# Patient Record
Sex: Male | Born: 1970 | Race: White | Hispanic: Yes | Marital: Married | Smoking: Never smoker
Health system: Southern US, Community
[De-identification: ages and names within clinical notes are randomized; demographics above are authoritative.]

## PROBLEM LIST (undated history)

## (undated) DIAGNOSIS — C44300 Unspecified malignant neoplasm of skin of unspecified part of face: Secondary | ICD-10-CM

## (undated) DIAGNOSIS — E0590 Thyrotoxicosis, unspecified without thyrotoxic crisis or storm: Secondary | ICD-10-CM

## (undated) DIAGNOSIS — R634 Abnormal weight loss: Secondary | ICD-10-CM

## (undated) DIAGNOSIS — R439 Unspecified disturbances of smell and taste: Secondary | ICD-10-CM

## (undated) DIAGNOSIS — E119 Type 2 diabetes mellitus without complications: Secondary | ICD-10-CM

## (undated) HISTORY — DX: Unspecified disturbances of smell and taste: R43.9

## (undated) HISTORY — DX: Abnormal weight loss: R63.4

## (undated) HISTORY — DX: Thyrotoxicosis, unspecified without thyrotoxic crisis or storm: E05.90

## (undated) HISTORY — DX: Type 2 diabetes mellitus without complications: E11.9

## (undated) HISTORY — DX: Unspecified malignant neoplasm of skin of unspecified part of face: C44.300

---

## 2006-04-25 ENCOUNTER — Ambulatory Visit: Payer: Self-pay | Admitting: Family Medicine

## 2006-05-02 ENCOUNTER — Ambulatory Visit: Payer: Self-pay | Admitting: Family Medicine

## 2006-05-02 LAB — CONVERTED CEMR LAB
ALT: 38 units/L (ref 0–40)
AST: 33 units/L (ref 0–37)
Albumin: 3.9 g/dL (ref 3.5–5.2)
Alkaline Phosphatase: 50 units/L (ref 39–117)
Total Bilirubin: 0.8 mg/dL (ref 0.3–1.2)
Triglyceride fasting, serum: 44 mg/dL (ref 0–149)
VLDL: 9 mg/dL (ref 0–40)

## 2006-06-01 ENCOUNTER — Ambulatory Visit: Payer: Self-pay | Admitting: Family Medicine

## 2007-05-13 ENCOUNTER — Ambulatory Visit: Payer: Self-pay | Admitting: Family Medicine

## 2007-05-13 DIAGNOSIS — R439 Unspecified disturbances of smell and taste: Secondary | ICD-10-CM

## 2007-05-13 HISTORY — DX: Unspecified disturbances of smell and taste: R43.9

## 2007-05-14 LAB — CONVERTED CEMR LAB
Cholesterol: 162 mg/dL (ref 0–200)
HDL: 44.9 mg/dL (ref >39.0)
LDL Cholesterol: 103 mg/dL — ABNORMAL HIGH (ref 0–99)
Total CHOL/HDL Ratio: 3.6
Triglycerides: 72 mg/dL (ref 0–149)
VLDL: 14 mg/dL (ref 0–40)

## 2007-07-04 HISTORY — PX: VASECTOMY: SHX75

## 2008-04-07 ENCOUNTER — Ambulatory Visit: Payer: Self-pay | Admitting: Family Medicine

## 2008-04-07 LAB — CONVERTED CEMR LAB
ALT: 20 units/L (ref 0–53)
Albumin: 4.3 g/dL (ref 3.5–5.2)
Alkaline Phosphatase: 54 units/L (ref 39–117)
BUN: 13 mg/dL (ref 6–23)
Bilirubin, Direct: 0.1 mg/dL (ref 0.0–0.3)
CO2: 30 meq/L (ref 19–32)
Calcium: 9.3 mg/dL (ref 8.4–10.5)
Eosinophils Relative: 5.7 % — ABNORMAL HIGH (ref 0.0–5.0)
Glucose, Bld: 85 mg/dL (ref 70–99)
HCT: 40.5 % (ref 39.0–52.0)
Hemoglobin: 14 g/dL (ref 13.0–17.0)
LDL Cholesterol: 101 mg/dL — ABNORMAL HIGH (ref 0–99)
Lymphocytes Relative: 38.5 % (ref 12.0–46.0)
Monocytes Absolute: 0.3 10*3/uL (ref 0.1–1.0)
Monocytes Relative: 6.8 % (ref 3.0–12.0)
Neutro Abs: 2.2 10*3/uL (ref 1.4–7.7)
Potassium: 4.3 meq/L (ref 3.5–5.1)
RDW: 12.5 % (ref 11.5–14.6)
Sodium: 139 meq/L (ref 135–145)
Total CHOL/HDL Ratio: 4.1
Total Protein: 6.8 g/dL (ref 6.0–8.3)
Triglycerides: 83 mg/dL (ref 0–149)
WBC: 4.5 10*3/uL (ref 4.5–10.5)

## 2008-04-09 ENCOUNTER — Encounter (INDEPENDENT_AMBULATORY_CARE_PROVIDER_SITE_OTHER): Payer: Self-pay | Admitting: *Deleted

## 2009-04-09 ENCOUNTER — Ambulatory Visit: Payer: Self-pay | Admitting: Family Medicine

## 2009-04-14 ENCOUNTER — Encounter (INDEPENDENT_AMBULATORY_CARE_PROVIDER_SITE_OTHER): Payer: Self-pay | Admitting: *Deleted

## 2009-04-14 LAB — CONVERTED CEMR LAB
ALT: 20 U/L
AST: 22 U/L
Albumin: 4.2 g/dL
Alkaline Phosphatase: 60 U/L
BUN: 13 mg/dL
Basophils Absolute: 0 K/uL
Basophils Relative: 0.7 %
Bilirubin, Direct: 0.1 mg/dL
CO2: 22 meq/L
Calcium: 9.2 mg/dL
Chloride: 109 meq/L
Cholesterol: 164 mg/dL
Creatinine, Ser: 1 mg/dL
Eosinophils Absolute: 0.3 K/uL
Eosinophils Relative: 6.6 % — ABNORMAL HIGH
GFR calc non Af Amer: 89.02 mL/min
Glucose, Bld: 81 mg/dL
HCT: 42.8 %
HDL: 42.6 mg/dL
Hemoglobin: 14.2 g/dL
LDL Cholesterol: 111 mg/dL — ABNORMAL HIGH
Lymphocytes Relative: 34.6 %
Lymphs Abs: 1.5 K/uL
MCHC: 33.1 g/dL
MCV: 87.1 fL
Monocytes Absolute: 0.3 K/uL
Monocytes Relative: 7.6 %
Neutro Abs: 2.3 K/uL
Neutrophils Relative %: 50.5 %
Platelets: 201 K/uL
Potassium: 4.5 meq/L
RBC: 4.91 M/uL
RDW: 12.3 %
Sodium: 141 meq/L
TSH: 1.22 u[IU]/mL
Total Bilirubin: 1.2 mg/dL
Total CHOL/HDL Ratio: 4
Total Protein: 6.6 g/dL
Triglycerides: 53 mg/dL
VLDL: 10.6 mg/dL
WBC: 4.4 10*3/microliter — ABNORMAL LOW

## 2010-04-29 ENCOUNTER — Ambulatory Visit: Payer: Self-pay | Admitting: Family Medicine

## 2010-04-29 DIAGNOSIS — M25549 Pain in joints of unspecified hand: Secondary | ICD-10-CM | POA: Insufficient documentation

## 2010-05-03 LAB — CONVERTED CEMR LAB
Albumin: 3.9 g/dL (ref 3.5–5.2)
Basophils Relative: 0.7 % (ref 0.0–3.0)
CO2: 28 meq/L (ref 19–32)
Chloride: 107 meq/L (ref 96–112)
Creatinine, Ser: 1 mg/dL (ref 0.4–1.5)
Eosinophils Absolute: 0.3 10*3/uL (ref 0.0–0.7)
Hemoglobin: 14.1 g/dL (ref 13.0–17.0)
MCHC: 34.5 g/dL (ref 30.0–36.0)
MCV: 85.2 fL (ref 78.0–100.0)
Monocytes Absolute: 0.5 10*3/uL (ref 0.1–1.0)
Neutro Abs: 2.2 10*3/uL (ref 1.4–7.7)
Neutrophils Relative %: 48.9 % (ref 43.0–77.0)
RBC: 4.82 M/uL (ref 4.22–5.81)
TSH: 0.09 microintl units/mL — ABNORMAL LOW (ref 0.35–5.50)
Total CHOL/HDL Ratio: 4
Total Protein: 6.7 g/dL (ref 6.0–8.3)
Triglycerides: 65 mg/dL (ref 0.0–149.0)

## 2010-08-02 NOTE — Assessment & Plan Note (Signed)
Summary: CPX AND FASTING LABS///[facility]   Vital Signs:  Patient profile:   40 year old male Height:      66 inches Weight:      148 pounds BMI:     23.97 Pulse rate:   78 / minute BP sitting:   100 / 70  (left arm)  Vitals Entered By: Doristine Devoid CMA (April 29, 2010 8:05 AM) CC: CPX AND LABS    History of Present Illness: 40 yo man here today for CPE.    R hand pain- R hand dominant, pain w/ applying force.  pain localized to 1st MCP joint and heel of hand.  no known injury.  pain for 5-6 months.  pain w/ any type of pressure or force.  hasn't tried any OTC meds.  Preventive Screening-Counseling & Management  Alcohol-Tobacco     Alcohol drinks/day: <1     Smoking Status: never  Caffeine-Diet-Exercise     Does Patient Exercise: yes     Type of exercise: walk, run, lift      Sexual History:  currently monogamous.        Drug Use:  never.    Current Medications (verified): 1)  None  Allergies (verified): No Known Drug Allergies  Past History:  Past medical, surgical, family and social histories (including risk factors) reviewed, and no changes noted (except as noted below).  Past Medical History: Reviewed history from 05/13/2007 and no changes required. Unremarkable  Past Surgical History: Reviewed history from 04/07/2008 and no changes required. vasectomy 1/09  Family History: Reviewed history from 04/07/2008 and no changes required. mom and dad are alive and well no cancer, DM, CAD  Social History: Reviewed history from 04/07/2008 and no changes required. Occupation:[employer] Married 1 child- 2007 Never Smoked Alcohol use-yes: socially Drug use-no Regular exercise-yes  Review of Systems  The patient denies anorexia, fever, weight loss, weight gain, vision loss, decreased hearing, hoarseness, chest pain, syncope, dyspnea on exertion, peripheral edema, prolonged cough, headaches, abdominal pain, melena, hematochezia, severe indigestion/heartburn,  hematuria, suspicious skin lesions, depression, abnormal bleeding, enlarged lymph nodes, and testicular masses.    Physical Exam  General:  Well-developed,well-nourished,in no acute distress; alert,appropriate and cooperative throughout examination Head:  Normocephalic and atraumatic without obvious abnormalities. No apparent alopecia or balding. Eyes:  No corneal or conjunctival inflammation noted. EOMI. Perrla. Funduscopic exam benign, without hemorrhages, exudates or papilledema. Vision grossly normal. Ears:  External ear exam shows no significant lesions or deformities.  Otoscopic examination reveals clear canals, tympanic membranes are intact bilaterally without bulging, retraction, inflammation or discharge. Hearing is grossly normal bilaterally. Nose:  External nasal examination shows no deformity or inflammation. Nasal mucosa are pink and moist without lesions or exudates. Mouth:  Oral mucosa and oropharynx without lesions or exudates.  Teeth in good repair. Neck:  No deformities, masses, or tenderness noted. Lungs:  Normal respiratory effort, chest expands symmetrically. Lungs are clear to auscultation, no crackles or wheezes. Heart:  Normal rate and regular rhythm. S1 and S2 normal without gallop, murmur, click, rub or other extra sounds. Abdomen:  Bowel sounds positive,abdomen soft and non-tender without masses, organomegaly or hernias noted. Genitalia:  Testes bilaterally descended without nodularity, tenderness or masses. No scrotal masses or lesions. No penis lesions or urethral discharge. Msk:  + TTP over 1st CMC and MCP joint on R.  pain only on palmar surface.  pain w/ abduction and adduction of thumb.  no redness or swelling Pulses:  +2 carotids, radials, DP Extremities:  No clubbing,  cyanosis, edema, or deformity noted with normal full range of motion of all joints.   Neurologic:  No cranial nerve deficits noted. Station and gait are normal. Plantar reflexes are down-going  bilaterally. DTRs are symmetrical throughout. Sensory, motor and coordinative functions appear intact. Skin:  Intact without suspicious lesions or rashes Cervical Nodes:  No lymphadenopathy noted Inguinal Nodes:  No significant adenopathy Psych:  Cognition and judgment appear intact. Alert and cooperative with normal attention span and concentration. No apparent delusions, illusions, hallucinations   Impression & Recommendations:  Problem # 1:  PREVENTIVE HEALTH CARE (ICD-V70.0) Assessment Unchanged PE WNL.  check labs.  anticipatory guidance provided. Orders: Venipuncture (13244) TLB-Lipid Panel (80061-LIPID) TLB-BMP (Basic Metabolic Panel-BMET) (80048-METABOL) TLB-CBC Platelet - w/Differential (85025-CBCD) TLB-Hepatic/Liver Function Pnl (80076-HEPATIC) TLB-TSH (Thyroid Stimulating Hormone) (84443-TSH)  Problem # 2:  HAND, PAIN (WNU-272.53) Assessment: New pain over 1st CMC and MCP joint on R.  start NSAIDs.  if no improvement refer to ortho.  Pt expresses understanding and is in agreement w/ this plan.  Complete Medication List: 1)  Naproxen 500 Mg Tabs (Naproxen) .Marland Kitchen.. 1 tab by mouth two times a day x10 days and then as needed.  take w/ food.  Patient Instructions: 1)  Follow up in 1 year or as needed 2)  We'll notify you of your lab results 3)  Keep up the good work!  Your exam looks great! 4)  Take the Naproxen for your hand pain- if it's not better in 2 weeks, call me and we'll send you to the orthopedic doctor 5)  Call with any questions or concerns 6)  Have a great holiday season!! Prescriptions: NAPROXEN 500 MG TABS (NAPROXEN) 1 tab by mouth two times a day x10 days and then as needed.  take w/ food.  #60 x 0   Entered and Authorized by:   Neena Rhymes MD   Signed by:   Neena Rhymes MD on 04/29/2010   Method used:   Print then Give to Patient   RxID:   [id number]    Orders Added: 1)  Venipuncture [75643] 2)  TLB-Lipid Panel [80061-LIPID] 3)  TLB-BMP  (Basic Metabolic Panel-BMET) [80048-METABOL] 4)  TLB-CBC Platelet - w/Differential [85025-CBCD] 5)  TLB-Hepatic/Liver Function Pnl [80076-HEPATIC] 6)  TLB-TSH (Thyroid Stimulating Hormone) [84443-TSH] 7)  Est. Patient 18-39 years [32951]

## 2010-08-26 ENCOUNTER — Ambulatory Visit (INDEPENDENT_AMBULATORY_CARE_PROVIDER_SITE_OTHER): Payer: Managed Care, Other (non HMO) | Admitting: Family Medicine

## 2010-08-26 ENCOUNTER — Encounter: Payer: Self-pay | Admitting: Family Medicine

## 2010-08-26 DIAGNOSIS — R634 Abnormal weight loss: Secondary | ICD-10-CM

## 2010-08-26 HISTORY — DX: Abnormal weight loss: R63.4

## 2010-08-29 DIAGNOSIS — E119 Type 2 diabetes mellitus without complications: Secondary | ICD-10-CM | POA: Insufficient documentation

## 2010-08-29 DIAGNOSIS — E0590 Thyrotoxicosis, unspecified without thyrotoxic crisis or storm: Secondary | ICD-10-CM

## 2010-08-29 DIAGNOSIS — Z8639 Personal history of other endocrine, nutritional and metabolic disease: Secondary | ICD-10-CM | POA: Insufficient documentation

## 2010-08-29 HISTORY — DX: Thyrotoxicosis, unspecified without thyrotoxic crisis or storm: E05.90

## 2010-08-29 LAB — CONVERTED CEMR LAB
AST: 23 units/L (ref 0–37)
Albumin: 3.9 g/dL (ref 3.5–5.2)
Alkaline Phosphatase: 107 units/L (ref 39–117)
Basophils Relative: 1 % (ref 0–1)
CO2: 25 meq/L (ref 19–32)
Glucose, Bld: 98 mg/dL (ref 70–99)
Lymphs Abs: 1.6 10*3/uL (ref 0.7–4.0)
Monocytes Relative: 10 % (ref 3–12)
Neutro Abs: 1.6 10*3/uL — ABNORMAL LOW (ref 1.7–7.7)
Neutrophils Relative %: 41 % — ABNORMAL LOW (ref 43–77)
Potassium: 4.6 meq/L (ref 3.5–5.3)
RBC: 5.04 M/uL (ref 4.22–5.81)
Sodium: 140 meq/L (ref 135–145)
T3, Free: 14.6 pg/mL — ABNORMAL HIGH (ref 2.3–4.2)
Total Bilirubin: 0.5 mg/dL (ref 0.3–1.2)
WBC: 3.8 10*3/uL — ABNORMAL LOW (ref 4.0–10.5)

## 2010-08-30 NOTE — Assessment & Plan Note (Signed)
Summary: weight loss/cbs   Vital Signs:  Patient profile:   40 year old male Weight:      130 pounds BMI:     21.06 O2 Sat:      98 % on Room air Temp:     98.7 degrees F oral Pulse rate:   106 / minute BP sitting:   108 / 60  (left arm)  Vitals Entered By: Doristine Devoid CMA (August 26, 2010 2:50 PM)  O2 Flow:  Room air CC: weight loss HA and fatigue intermittent diarrhea xdec.   History of Present Illness: 40 yo man here today for weight loss.  has lost 18 lbs since CPE the end of Oct.  wife reports sxs started in December.  denies palpitations.  + fatigue.  intermittant, infrequent abdominal pain associated w/ loose stools.  + HAs, mild photophobia.  also having 'pimple like rash' on back and chest.  having tremor and shaking.  increased hunger and thirst  TSH was low on 10/28- request was made for free T3/T4 to be added to labs.  CMA made request the following Monday.  no results were ever posted.  Current Medications (verified): 1)  None  Allergies (verified): No Known Drug Allergies  Past History:  Past medical, surgical, family and social histories (including risk factors) reviewed for relevance to current acute and chronic problems.  Past Medical History: Reviewed history from 05/13/2007 and no changes required. Unremarkable  Past Surgical History: Reviewed history from 04/07/2008 and no changes required. vasectomy 1/09  Family History: Reviewed history from 04/07/2008 and no changes required. mom and dad are alive and well no cancer, DM, CAD  Social History: Reviewed history from 04/07/2008 and no changes required. Occupation:[employer] Married 1 child- 2007 Never Smoked Alcohol use-yes: socially Drug use-no Regular exercise-yes  Review of Systems      See HPI  Physical Exam  General:  thin appearing, otherwise normal.  NAD Head:  NCAT Eyes:  PERRL, EOMI Neck:  No deformities, masses, or tenderness noted. Lungs:  Normal respiratory effort, chest  expands symmetrically. Lungs are clear to auscultation, no crackles or wheezes. Heart:  Normal rate and regular rhythm. S1 and S2 normal without gallop, murmur, click, rub or other extra sounds. Abdomen:  Bowel sounds positive,abdomen soft and non-tender without masses, organomegaly or hernias noted. Pulses:  +2 carotids, radials, DP Extremities:  No clubbing, cyanosis, edema, or deformity noted with normal full range of motion of all joints.   Neurologic:  No cranial nerve deficits noted. Station and gait are normal. Plantar reflexes are down-going bilaterally. DTRs are symmetrical throughout. Sensory, motor and coordinative functions appear intact. Skin:  Intact without suspicious lesions or rashes.  scatterred white heads on chest and back Cervical Nodes:  No lymphadenopathy noted Axillary Nodes:  No palpable lymphadenopathy Psych:  Cognition and judgment appear intact. Alert and cooperative with normal attention span and concentration. No apparent delusions, illusions, hallucinations   Impression & Recommendations:  Problem # 1:  WEIGHT LOSS (ICD-783.21) Assessment New most likely due to untreated hyperthyroid.  will repeat labs.  must also r/o DM due to increased hunger and thirst.  will also r/o leukemia w/ CBC w/ diff.  apologized to pt and wife for mistake in add-on labs and that this wasn't addressed sooner.  will proceed w/ weight loss work up and call family on Monday w/ follow up plan.  pt understanding of mistake and appreciative that I brought this to management's attention.  will follow. Orders: Venipuncture (56213)  TLB-TSH (Thyroid Stimulating Hormone) [phone]) Specimen Handling (78295)  Patient Instructions: 1)  We will notify you first thing Monday of your lab results 2)  Make sure you are eating more than usual 3)  Drink plenty of fluids 4)  Call with any questions or concerns 5)  Hang in there!  We'll get to the bottom of this!!!   Orders Added: 1)  Venipuncture  [36415] 2)  TLB-TSH (Thyroid Stimulating Hormone) [84443-TSH] 3)  Specimen Handling [99000] 4)  Est. Patient Level III [62130]

## 2010-08-31 ENCOUNTER — Ambulatory Visit: Payer: Managed Care, Other (non HMO) | Admitting: Endocrinology

## 2010-09-02 ENCOUNTER — Ambulatory Visit (INDEPENDENT_AMBULATORY_CARE_PROVIDER_SITE_OTHER): Payer: Managed Care, Other (non HMO) | Admitting: Endocrinology

## 2010-09-02 ENCOUNTER — Encounter: Payer: Self-pay | Admitting: Endocrinology

## 2010-09-02 DIAGNOSIS — E0590 Thyrotoxicosis, unspecified without thyrotoxic crisis or storm: Secondary | ICD-10-CM

## 2010-09-05 ENCOUNTER — Telehealth: Payer: Self-pay | Admitting: Endocrinology

## 2010-09-07 ENCOUNTER — Telehealth: Payer: Self-pay | Admitting: Endocrinology

## 2010-09-13 NOTE — Progress Notes (Signed)
Summary: med clarification  Phone Note Other Incoming Call back at [phone] ref #. [phone]   Caller: [facility] Summary of Call: [facility] called requesting clarification on pt's Methimazole rx. Per pharmacist, the maximum recommended dose is 60mg /day in 1-3 divided doses.  Initial call taken by: Brenton Grills CMA Duncan Dull),  September 08, 2010 8:30 AM  Follow-up for Phone Call        please fill rx as prescribed.  Follow-up by: Minus Breeding MD,  September 08, 2010 8:42 AM  Additional Follow-up for Phone Call Additional follow up Details #1::        [facility] informed of MD's advisement. Additional Follow-up by: Brenton Grills CMA Duncan Dull),  September 08, 2010 9:10 AM

## 2010-09-13 NOTE — Progress Notes (Signed)
Summary: Call Report  Phone Note Other Incoming   Caller: [facility] Call Report Triage Record Num: [id number] Operator: Amy Head Patient Name: Christopher Mckenzie Call Date & Time: 09/03/2010 12:10:41PM Patient Phone: [phone] PCP: Romero Belling Patient Gender: Male PCP Fax : [phone] Patient DOB: [birth year] Practice Name: [facility] Reason for Call: Wife/[name], calling and states that pt was seen in office 09/02/10 and dx with Hyperthyroidism and given RX for Methimazole 10MG  4 tabs BID, #240, One Refill. (Verified by Estevan Oaks, RN in [facility]) Needs order faxed to [facility] @ [phone]. Protocol(s) Used: Office Note Recommended Outcome per Protocol: Information Noted and Sent to Office Reason for Outcome: Caller information to office Care Advice:  ~ 09/03/2010 12:35:45PM Page 1 of 1 CAN Initial call taken by: Margaret Pyle, CMA,  September 05, 2010 8:30 AM    Prescriptions: ONETOUCH ULTRA BLUE  STRP (GLUCOSE BLOOD) once daily, and lancets 250.00  #150 x 3   Entered by:   Margaret Pyle, CMA   Authorized by:   Minus Breeding MD   Signed by:   Margaret Pyle, CMA on 09/05/2010   Method used:   Faxed to ...       [facility] (mail-order)             , [place]         Ph: [phone]       Fax: [phone]   RxID:   [id number] METHIMAZOLE 10 MG TABS (METHIMAZOLE) 4 tabs two times a day  #720 x 1   Entered by:   Margaret Pyle, CMA   Authorized by:   Minus Breeding MD   Signed by:   Margaret Pyle, CMA on 09/05/2010   Method used:   Faxed to ...       [facility] (mail-order)             , [place]         Ph: [phone]       Fax: [phone]   RxID:   [id number]

## 2010-09-13 NOTE — Assessment & Plan Note (Signed)
Summary: NEW ENDO CONSULT/ HYPERTHYROID / DM / DR Beverely Low Rosann Auerbach Natale Milch   Vital Signs:  Patient profile:   40 year old male Height:      66 inches (167.64 cm) Weight:      134 pounds (60.91 kg) BMI:     21.71 O2 Sat:      97 % on Room air Temp:     98.0 degrees F (36.67 degrees C) oral Pulse rate:   95 / minute BP sitting:   116 / 62  (left arm) Cuff size:   regular  Vitals Entered By: Brenton Grills CMA Duncan Dull) (September 02, 2010 2:58 PM)  O2 Flow:  Room air CC: New Endo Consult/Hyperthyroidism/Dr. Tabori/aj Is Patient Diabetic? No   Referring Provider:  Neena Rhymes MD Primary Provider:  Neena Rhymes MD  CC:  New Endo Consult/Hyperthyroidism/Dr. Tabori/aj.  History of Present Illness: pt states few mos of moderate weakness of the thighs, and assoc weight loss of 18 lbs.  Current Medications (verified): 1)  None  Allergies (verified): No Known Drug Allergies  Past History:  Past Medical History: DIABETES MELLITUS (ICD-250.00) HYPERTHYROIDISM (ICD-242.90) WEIGHT LOSS (ICD-783.21) HAND, PAIN (ICD-719.44) ANOSMIA (ICD-781.1) PREVENTIVE HEALTH CARE (ICD-V70.0)  Family History: Reviewed history from 04/07/2008 and no changes required. mom and dad are alive and well no cancer, DM, CAD Kidney Disease (Parent) mother has thyroid and kidney cancer.  Social History: Reviewed history from 04/07/2008 and no changes required. Occupation:[employer] Married 1 child- 2007 Never Smoked Alcohol use-yes: socially Drug use-no Regular exercise-yes originally from [place], in [place] x many years  Review of Systems  The patient denies fever.         he reports fatigue, headache, light senstivity, excessive diaphoresis, and tremor.  he has intermittent dyspeptic sxs, and diarrhea.   denies hoarseness, double vision, palpitations, sob, polyuria, myalgias, numbness, anxiety, hypoglycemia, easy bruising, and rhinorrhea.  Physical Exam  General:  thin appearing, otherwise  normal.  NAD Head:  head: no deformity eyes: no periorbital swelling, no proptosis external nose and ears are normal mouth: no lesion seen Neck:  thyroid is 2x normal size on the right, and normal on the left. no nodule Lungs:  Clear to auscultation bilaterally. Normal respiratory effort.  Heart:  Regular rate and rhythm without murmurs or gallops noted. Normal S1,S2.   Abdomen:  abdomen is soft, nontender.  no hepatosplenomegaly.   not distended.  no hernia  Msk:  muscle bulk and strength are grossly, diffusely, and lsightly decreased.  no obvious joint swelling.  gait is normal and steady  Extremities:  no deformity.  no edema  Neurologic:  cn 2-12 grossly intact.   readily moves all 4's.   sensation is intact to touch on all 4's there is a slight tremor of the hands Skin:  normal texture and temp.  no rash.  not diaphoretic  Cervical Nodes:  No significant adenopathy.  Psych:  Alert and cooperative; normal mood and affect; normal attention span and concentration.     Impression & Recommendations:  Problem # 1:  HYPERTHYROIDISM (ICD-242.90)  Problem # 2:  DIABETES MELLITUS (ICD-250.00) he is evolving type 1 dm  Problem # 3:  WEIGHT LOSS (ICD-783.21) due to #1  Medications Added to Medication List This Visit: 1)  Methimazole 10 Mg Tabs (Methimazole) .... 4 tabs two times a day 2)  Alprazolam 0.25 Mg Tabs (Alprazolam) .Marland Kitchen.. 1 every 4 hrs as needed for thyroid symptoms 3)  Onetouch Ultra Blue Strp (Glucose blood) .... Once  daily, and lancets 250.00  Other Orders: Consultation Level IV (11914)  Patient Instructions: 1)  take methimazole 4x10 mg, two times a day 2)  if ever you have fever while taking this , stop it and call us, because of the risk of a rare side-effect 3)  Please schedule a follow-up appointment in 2-3 weeks. 4)  take alprazolam as needed for thyroid symptoms. 5)  here are 2 blood sugar meters.  i have sent a prescription for strips to your pharmacy. 6)   check your blood sugar 1 time a day.  vary the time of day when you check, between before the 3 meals, and at bedtime.  also check if you have symptoms of your blood sugar being too high or too low.  please keep a record of the readings and bring it to your next appointment here.  please call us sooner if you are having low blood sugar episodes. Prescriptions: ONETOUCH ULTRA BLUE  STRP (GLUCOSE BLOOD) once daily, and lancets 250.00  #50 x 11   Entered and Authorized by:   Minus Breeding MD   Signed by:   Minus Breeding MD on 09/02/2010   Method used:   Electronically to        [facility] [phone]* (retail)       [address]       [place], [place]  [address]       Ph: [phone] or [phone]       Fax: [phone]   RxID:   [id number] ALPRAZOLAM 0.25 MG TABS (ALPRAZOLAM) 1 every 4 hrs as needed for thyroid symptoms  #50 x 1   Entered and Authorized by:   Minus Breeding MD   Signed by:   Minus Breeding MD on 09/02/2010   Method used:   Print then Give to Patient   RxID:   [id number] METHIMAZOLE 10 MG TABS (METHIMAZOLE) 4 tabs two times a day  #240 x 2   Entered and Authorized by:   Minus Breeding MD   Signed by:   Minus Breeding MD on 09/02/2010   Method used:   Electronically to        [facility] [phone]* (retail)       [address]       [place], [place]  [address]       Ph: [phone] or [phone]       Fax: [phone]   RxID:   [id number]    Orders Added: 1)  Consultation Level IV [27035]

## 2010-09-23 ENCOUNTER — Ambulatory Visit: Payer: Managed Care, Other (non HMO) | Admitting: Endocrinology

## 2010-10-13 ENCOUNTER — Encounter: Payer: Self-pay | Admitting: Endocrinology

## 2010-10-18 ENCOUNTER — Encounter: Payer: Self-pay | Admitting: Endocrinology

## 2010-10-18 ENCOUNTER — Other Ambulatory Visit (INDEPENDENT_AMBULATORY_CARE_PROVIDER_SITE_OTHER): Payer: Managed Care, Other (non HMO)

## 2010-10-18 ENCOUNTER — Ambulatory Visit (INDEPENDENT_AMBULATORY_CARE_PROVIDER_SITE_OTHER): Payer: Managed Care, Other (non HMO) | Admitting: Endocrinology

## 2010-10-18 VITALS — BP 104/62 | HR 73 | Temp 98.6°F | Ht 65.0 in | Wt 130.8 lb

## 2010-10-18 DIAGNOSIS — E0590 Thyrotoxicosis, unspecified without thyrotoxic crisis or storm: Secondary | ICD-10-CM

## 2010-10-18 LAB — T4, FREE: Free T4: 0.87 ng/dL (ref 0.60–1.60)

## 2010-10-18 NOTE — Patient Instructions (Addendum)
blood tests are being ordered for you today.  please call [phone] to hear your test results. Please make a follow-up appointment in 1 month Continue to follow the blood sugar, and please call if it stays over 200. (update: i left message on phone-tree:  Reduce tapazole to 2x10 mg bid)

## 2010-10-18 NOTE — Progress Notes (Signed)
  Subjective:    Patient ID: Christopher Mckenzie, male    DOB: 02-26-1971, 40 y.o.   MRN: [id number]  HPI Although he has not yet regained weight, he better in general.  He still has some insomnia and night sweats. he brings a record of his cbg's which i have reviewed today.  It varies from 80-160.  The vast majority are approx 100. Past Medical History  Diagnosis Date  . DIABETES MELLITUS 08/29/2010  . HYPERTHYROIDISM 08/29/2010  . WEIGHT LOSS 08/26/2010  . ANOSMIA 05/13/2007   Past Surgical History  Procedure Date  . Vasectomy 07/2007    reports that he has never smoked. He does not have any smokeless tobacco history on file. He reports that he drinks alcohol. He reports that he does not use illicit drugs. family history includes Cancer in his mother and Kidney disease in his mother.  There is no history of Diabetes and Heart disease. No Known Allergies  Review of Systems Denies fever.     Objective:   Physical Exam GENERAL: no distress Neck:  Thyroid is 2x normal size on the right, and normal on the left.  No nodule.   Skin:  Not diaphoretic.    Lab Results  Component Value Date   TSH 0.07* 10/18/2010  free t4 is improved    Assessment & Plan:  Hyperthyroidism, improved Dm, unchanged

## 2010-11-09 ENCOUNTER — Other Ambulatory Visit: Payer: Self-pay | Admitting: *Deleted

## 2010-11-09 MED ORDER — ALPRAZOLAM 0.25 MG PO TABS: ORAL_TABLET | ORAL | Status: DC

## 2010-11-09 NOTE — Telephone Encounter (Signed)
R'cd fax from [facility] for refill of pt's Alprazolam rx-please advise  Last OV-04/17/012  Last Filled-09/14/2010

## 2010-11-09 NOTE — Telephone Encounter (Signed)
i signed

## 2010-11-10 NOTE — Telephone Encounter (Signed)
Rx faxed to pharmacy  

## 2010-11-17 ENCOUNTER — Other Ambulatory Visit (INDEPENDENT_AMBULATORY_CARE_PROVIDER_SITE_OTHER): Payer: Managed Care, Other (non HMO)

## 2010-11-17 ENCOUNTER — Encounter: Payer: Self-pay | Admitting: Endocrinology

## 2010-11-17 ENCOUNTER — Ambulatory Visit (INDEPENDENT_AMBULATORY_CARE_PROVIDER_SITE_OTHER): Payer: Managed Care, Other (non HMO) | Admitting: Endocrinology

## 2010-11-17 DIAGNOSIS — E0590 Thyrotoxicosis, unspecified without thyrotoxic crisis or storm: Secondary | ICD-10-CM

## 2010-11-17 DIAGNOSIS — R51 Headache: Secondary | ICD-10-CM | POA: Insufficient documentation

## 2010-11-17 DIAGNOSIS — E119 Type 2 diabetes mellitus without complications: Secondary | ICD-10-CM

## 2010-11-17 DIAGNOSIS — R519 Headache, unspecified: Secondary | ICD-10-CM | POA: Insufficient documentation

## 2010-11-17 LAB — HEMOGLOBIN A1C: Hgb A1c MFr Bld: 5.3 % (ref 4.6–6.5)

## 2010-11-17 NOTE — Patient Instructions (Addendum)
blood tests are being ordered for you today.  please call [phone] to hear your test results. pending the test results, please continue the same medications for now Please make a follow-up appointment in 2 months check your blood sugar 1 time a day.  vary the time of day when you check, between before the 3 meals, and at bedtime.  also check if you have symptoms of your blood sugar being too high or too low.  please keep a record of the readings and bring it to your next appointment here.  please call us sooner if you are having low blood sugar episodes. good diet and exercise habits significanly improve the control of your diabetes.  please let me know if you wish to be referred to a dietician.  high blood sugar is very risky to your health.  you should see an eye doctor every year. controlling your blood pressure and cholesterol drastically reduces the damage diabetes does to your body.  this also applies to quitting smoking.  please discuss these with your doctor.  you should take an aspirin every day, unless you have been advised by a doctor not to. if ever you have fever while taking this medication, stop it and call us, because of the risk of a rare side-effect (update: i left message on phone-tree:  You don't need dm med now.  Reduce tapazole to 10 mg qd.  Call if you want to pursue i-131 rx).

## 2010-11-17 NOTE — Progress Notes (Signed)
  Subjective:    Patient ID: Christopher Mckenzie, male    DOB: 03-10-1971, 40 y.o.   MRN: [id number]  HPI pt states he feels well in general.  He has regained some of the weight he lost.  He takes tapazole only 10 mg bid. no cbg record, but states cbg's are in the low-100's. Past Medical History  Diagnosis Date  . DIABETES MELLITUS 08/29/2010  . HYPERTHYROIDISM 08/29/2010  . WEIGHT LOSS 08/26/2010  . ANOSMIA 05/13/2007    Past Surgical History  Procedure Date  . Vasectomy 07/2007    History   Social History  . Marital Status: Divorced    Spouse Name: N/A    Number of Children: 1  . Years of Education: N/A   Occupational History  .  [employer]   Social History Main Topics  . Smoking status: Never Smoker   . Smokeless tobacco: Not on file  . Alcohol Use: Yes     socially  . Drug Use: No  . Sexually Active:    Other Topics Concern  . Not on file   Social History Narrative   Originally from [place], [place] x many yearsRegular Exercise-YesMarried 1 child-2007    Current Outpatient Prescriptions on File Prior to Visit  Medication Sig Dispense Refill  . ALPRAZolam (XANAX) 0.25 MG tablet 1 every 4 hours as needed for thyroid symptoms  50 tablet  3  . glucose blood (ONE TOUCH ULTRA TEST) test strip Once daily, dx 250.00      . methimazole (TAPAZOLE) 10 MG tablet 1 tablet, two times a day        No Known Allergies  Family History  Problem Relation Age of Onset  . Diabetes Neg Hx   . Heart disease Neg Hx   . Kidney disease Mother     Kidney Cancer  . Cancer Mother     thyroid and kidney cancer    BP 102/62  Pulse 77  Temp(Src) 97.8 F (36.6 C) (Oral)  Ht 5\' 5"  (1.651 m)  Wt 138 lb (62.596 kg)  BMI 22.96 kg/m2  SpO2 97%    Review of Systems Denies fever.  He has a slight headache.    Objective:   Physical Exam GENERAL: no distress Neuro: no tremor Skin: not diaphoretic.    Lab Results  Component Value Date   TSH 5.87* 11/17/2010   Lab Results  Component  Value Date   HGBA1C 5.3 11/17/2010      Assessment & Plan:  Dm, improved.  No med needed now Hyperthyroidism, .improved.

## 2010-11-18 LAB — SEDIMENTATION RATE: Sed Rate: 3 mm/hr (ref 0–22)

## 2011-01-27 ENCOUNTER — Other Ambulatory Visit (INDEPENDENT_AMBULATORY_CARE_PROVIDER_SITE_OTHER): Payer: Managed Care, Other (non HMO)

## 2011-01-27 ENCOUNTER — Ambulatory Visit (INDEPENDENT_AMBULATORY_CARE_PROVIDER_SITE_OTHER): Payer: Managed Care, Other (non HMO) | Admitting: Endocrinology

## 2011-01-27 ENCOUNTER — Encounter: Payer: Self-pay | Admitting: Endocrinology

## 2011-01-27 VITALS — BP 102/68 | HR 64 | Temp 97.9°F | Ht 65.0 in | Wt 139.4 lb

## 2011-01-27 DIAGNOSIS — E0590 Thyrotoxicosis, unspecified without thyrotoxic crisis or storm: Secondary | ICD-10-CM

## 2011-01-27 NOTE — Patient Instructions (Addendum)
blood tests are being ordered for you today.  please call [phone] to hear your test results.  You will be prompted to enter the 9-digit "MRN" number that appears at the top left of this page, followed by #.  Then you will hear the message. if ever you have fever while taking this medication, stop it and call us, because of the risk of a rare side-effect If you decide to take the radioactive iodine treatment pill, you would first stop the methimazole pill.  A few weeks later, we would check a thyroid "scan" (a special, but easy and painless type of thyroid x ray).  It works like this: you go to the x-ray department of the hospital to swallow a pill, which contains a miniscule amount of radiation.  You will not notice any symptoms from this.  You will go back to the x-ray department the next day, to lie down in front of a camera.  The results of this will be sent to me.   Based on the results, i hope to order for you a treatment pill of radioactive iodine.  Although it is a larger amount of radiation, you will again notice no symptoms from this.  The pill is gone from your body in a few days (during which you should stay away from other people), but takes several months to work.  Therefore, you should resume the methimazole 5 days after the treatment. please return here approximately 6-8 weeks after the treatment.  This radioactive iodine treatment has been available for many years, and the only known side-effect is an underactive thyroid, which is very likely in your situation.  It is possible that i would eventually prescribe for you a thyroid hormone pill, which is very inexpensive.  You don't have to worry about side-effects of this thyroid hormone pill, because it is the same molecule your thyroid makes.

## 2011-01-27 NOTE — Progress Notes (Signed)
  Subjective:    Patient ID: Christopher Mckenzie, male    DOB: 1970/10/14, 40 y.o.   MRN: [id number]  HPI Pt states 1 month of moderately excessive diaphoresis, worst on the head, and assoc fatigue.  He has reduced tapazole to 10 mg qd, as advised. Past Medical History  Diagnosis Date  . DIABETES MELLITUS 08/29/2010  . HYPERTHYROIDISM 08/29/2010  . WEIGHT LOSS 08/26/2010  . ANOSMIA 05/13/2007    Past Surgical History  Procedure Date  . Vasectomy 07/2007    History   Social History  . Marital Status: Divorced    Spouse Name: N/A    Number of Children: 1  . Years of Education: N/A   Occupational History  .  [employer]   Social History Main Topics  . Smoking status: Never Smoker   . Smokeless tobacco: Not on file  . Alcohol Use: Yes     socially  . Drug Use: No  . Sexually Active:    Other Topics Concern  . Not on file   Social History Narrative   Originally from [place] x many yearsRegular Exercise-YesMarried 1 child-2007    Current Outpatient Prescriptions on File Prior to Visit  Medication Sig Dispense Refill  . ALPRAZolam (XANAX) 0.25 MG tablet 1 every 4 hours as needed for thyroid symptoms  50 tablet  3  . glucose blood (ONE TOUCH ULTRA TEST) test strip Once daily, dx 250.00      . methimazole (TAPAZOLE) 10 MG tablet 1 tablet, two times a day        No Known Allergies  Family History  Problem Relation Age of Onset  . Diabetes Neg Hx   . Heart disease Neg Hx   . Kidney disease Mother     Kidney Cancer  . Cancer Mother     thyroid and kidney cancer    BP 102/68  Pulse 64  Temp(Src) 97.9 F (36.6 C) (Oral)  Ht 5\' 5"  (1.651 m)  Wt 139 lb 6.4 oz (63.231 kg)  BMI 23.20 kg/m2  SpO2 96%  Review of Systems Denies tremor, fever, and palpitations.    Objective:   Physical Exam GENERAL: no distress Neck: thyroid is slightly and diffusely enlarged. Skin:  Not diaphoretic.    Assessment & Plan:  Hyperthyroidism, clinically slightly worse

## 2011-01-30 LAB — T4, FREE: Free T4: 0.75 ng/dL (ref 0.60–1.60)

## 2011-02-21 ENCOUNTER — Ambulatory Visit: Payer: Managed Care, Other (non HMO) | Admitting: Endocrinology

## 2011-05-05 ENCOUNTER — Encounter: Payer: Self-pay | Admitting: Family Medicine

## 2011-05-05 ENCOUNTER — Ambulatory Visit (INDEPENDENT_AMBULATORY_CARE_PROVIDER_SITE_OTHER): Payer: Managed Care, Other (non HMO) | Admitting: Family Medicine

## 2011-05-05 DIAGNOSIS — Z0000 Encounter for general adult medical examination without abnormal findings: Secondary | ICD-10-CM | POA: Insufficient documentation

## 2011-05-05 NOTE — Patient Instructions (Signed)
Schedule a lab visit- do not eat before this appt We'll notify you of your lab results once we have them Keep up the good work!  You look great! Call with any questions or concerns Happy Holidays!

## 2011-05-05 NOTE — Progress Notes (Signed)
  Subjective:    Patient ID: Christopher Mckenzie, male    DOB: 12-12-1970, 40 y.o.   MRN: [id number]  HPI CPE- seeing Dr Everardo All for hyperthyroid and DM (caused by hyperthyroid).  No concerns.   Review of Systems Patient reports no vision/hearing changes, anorexia, fever ,adenopathy, persistant/recurrent hoarseness, swallowing issues, chest pain, palpitations, edema, persistant/recurrent cough, hemoptysis, dyspnea (rest,exertional, paroxysmal nocturnal), gastrointestinal  bleeding (melena, rectal bleeding), abdominal pain, excessive heart burn, GU symptoms (dysuria, hematuria, voiding/incontinence issues) syncope, focal weakness, memory loss, numbness & tingling, skin/hair/nail changes, depression, anxiety, abnormal bruising/bleeding, musculoskeletal symptoms/signs.     Objective:   Physical Exam BP 110/65  Pulse 75  Temp(Src) 98.1 F (36.7 C) (Oral)  Ht 5' 5.5" (1.664 m)  Wt 143 lb (64.864 kg)  BMI 23.43 kg/m2  General Appearance:    Alert, cooperative, no distress, appears stated age  Head:    Normocephalic, without obvious abnormality, atraumatic  Eyes:    PERRL, conjunctiva/corneas clear, EOM's intact, fundi    benign, both eyes       Ears:    Normal TM's and external ear canals, both ears  Nose:   Nares normal, septum midline, mucosa normal, no drainage   or sinus tenderness  Throat:   Lips, mucosa, and tongue normal; teeth and gums normal  Neck:   Supple, symmetrical, trachea midline, no adenopathy;       thyroid:  No enlargement/tenderness/nodules  Back:     Symmetric, no curvature, ROM normal, no CVA tenderness  Lungs:     Clear to auscultation bilaterally, respirations unlabored  Chest wall:    No tenderness or deformity  Heart:    Regular rate and rhythm, S1 and S2 normal, no murmur, rub   or gallop  Abdomen:     Soft, non-tender, bowel sounds active all four quadrants,    no masses, no organomegaly  Genitalia:    Normal male without lesion, masses,discharge or tenderness    Rectal:    Deferred due to young age  Extremities:   Extremities normal, atraumatic, no cyanosis or edema  Pulses:   2+ and symmetric all extremities  Skin:   Skin color, texture, turgor normal, no rashes or lesions  Lymph nodes:   Cervical, supraclavicular, and axillary nodes normal  Neurologic:   CNII-XII intact. Normal strength, sensation and reflexes      throughout          Assessment & Plan:

## 2011-05-06 NOTE — Assessment & Plan Note (Signed)
Pt's PE WNL.  Future orders placed for labs.  Anticipatory guidance provided.  Pt declines flu shot.

## 2011-12-01 ENCOUNTER — Encounter: Payer: Self-pay | Admitting: Endocrinology

## 2011-12-01 ENCOUNTER — Ambulatory Visit (INDEPENDENT_AMBULATORY_CARE_PROVIDER_SITE_OTHER): Payer: 59 | Admitting: Endocrinology

## 2011-12-01 ENCOUNTER — Other Ambulatory Visit (INDEPENDENT_AMBULATORY_CARE_PROVIDER_SITE_OTHER): Payer: 59

## 2011-12-01 VITALS — BP 102/68 | HR 61 | Temp 97.0°F | Ht 65.5 in | Wt 142.0 lb

## 2011-12-01 DIAGNOSIS — E0590 Thyrotoxicosis, unspecified without thyrotoxic crisis or storm: Secondary | ICD-10-CM

## 2011-12-01 DIAGNOSIS — Z0000 Encounter for general adult medical examination without abnormal findings: Secondary | ICD-10-CM

## 2011-12-01 DIAGNOSIS — E119 Type 2 diabetes mellitus without complications: Secondary | ICD-10-CM

## 2011-12-01 LAB — CBC WITH DIFFERENTIAL/PLATELET
Eosinophils Relative: 5.4 % — ABNORMAL HIGH (ref 0.0–5.0)
MCV: 82.8 fl (ref 78.0–100.0)
Monocytes Absolute: 0.3 10*3/uL (ref 0.1–1.0)
Neutrophils Relative %: 48.2 % (ref 43.0–77.0)
Platelets: 199 10*3/uL (ref 150.0–400.0)
WBC: 3.8 10*3/uL — ABNORMAL LOW (ref 4.5–10.5)

## 2011-12-01 LAB — TSH: TSH: 0.05 u[IU]/mL — ABNORMAL LOW (ref 0.35–5.50)

## 2011-12-01 MED ORDER — METHIMAZOLE 10 MG PO TABS: 10.0000 mg | ORAL_TABLET | Freq: Three times a day (TID) | ORAL | Status: DC

## 2011-12-01 NOTE — Patient Instructions (Addendum)
blood tests are being requested for you today.  You will receive a letter with results. Please come back for a follow-up appointment in 6 months.   if ever you have fever while taking methimazole, stop it and call us, because of the risk of a rare side-effect check your blood sugar once a day.  vary the time of day when you check, between before the 3 meals, and at bedtime.  also check if you have symptoms of your blood sugar being too high or too low.  please keep a record of the readings and bring it to your next appointment here.  please call us sooner if your blood sugar goes below 70, or if it stays over 200. With time, your blood sugar will go high again.

## 2011-12-01 NOTE — Progress Notes (Signed)
  Subjective:    Patient ID: Christopher Mckenzie, male    DOB: 1971/05/31, 41 y.o.   MRN: [id number]  HPI Pt returns for f/u of hyperthyroidism.  He chose thionamide rx.  pt states he feels well in general. Pt alo returns for f/u of DM (dx'ed 2011).  He has not recently required any medication.  no cbg record, but states cbg's are well-controlled. Past Medical History  Diagnosis Date  . DIABETES MELLITUS 08/29/2010  . HYPERTHYROIDISM 08/29/2010  . WEIGHT LOSS 08/26/2010  . ANOSMIA 05/13/2007    Past Surgical History  Procedure Date  . Vasectomy 07/2007    History   Social History  . Marital Status: Divorced    Spouse Name: N/A    Number of Children: 1  . Years of Education: N/A   Occupational History  .  [employer]   Social History Main Topics  . Smoking status: Never Smoker   . Smokeless tobacco: Not on file  . Alcohol Use: Yes     socially  . Drug Use: No  . Sexually Active:    Other Topics Concern  . Not on file   Social History Narrative   Originally from [place], [place] x many yearsRegular Exercise-YesMarried 1 child-2007    Current Outpatient Prescriptions on File Prior to Visit  Medication Sig Dispense Refill  . glucose blood (ONE TOUCH ULTRA TEST) test strip Once daily, dx 250.00      . methimazole (TAPAZOLE) 10 MG tablet Take 1 tablet (10 mg total) by mouth 3 (three) times daily.  90 tablet  5  . ALPRAZolam (XANAX) 0.25 MG tablet 1 every 4 hours as needed for thyroid symptoms  50 tablet  3    No Known Allergies  Family History  Problem Relation Age of Onset  . Diabetes Neg Hx   . Heart disease Neg Hx   . Kidney disease Mother     Kidney Cancer  . Cancer Mother     thyroid and kidney cancer   BP 102/68  Pulse 61  Temp(Src) 97 F (36.1 C) (Oral)  Ht 5' 5.5" (1.664 m)  Wt 142 lb (64.411 kg)  BMI 23.27 kg/m2  SpO2 95%  Review of Systems Denies fever and weight change.      Objective:   Physical Exam VITAL SIGNS:  See vs page GENERAL: no  distress NECK: There is no palpable thyroid enlargement.  No thyroid nodule is palpable.  No palpable lymphadenopathy at the anterior neck. Pulses: dorsalis pedis intact bilat.   Feet: no deformity.  no ulcer on the feet.  feet are of normal color and temp.  no edema Neuro: sensation is intact to touch on the feet.    Lab Results  Component Value Date   HGBA1C 5.4 12/01/2011   Lab Results  Component Value Date   TSH 0.05* 12/01/2011      Assessment & Plan:  DM.  He is prob in "honeymoon" phase Hyperthyroidism, needs increased rx.

## 2011-12-07 ENCOUNTER — Other Ambulatory Visit: Payer: Self-pay | Admitting: *Deleted

## 2011-12-07 NOTE — Telephone Encounter (Signed)
R'cd fax from [facility] that rx sent in for Methimazole was not completed because pt's prescription benefits are not managed by [facility]. Called pt and left message to callback office to inform us what pharmacy he would like [place] to send rx to.

## 2011-12-12 MED ORDER — METHIMAZOLE 10 MG PO TABS: 10.0000 mg | ORAL_TABLET | Freq: Three times a day (TID) | ORAL | Status: DC

## 2011-12-12 NOTE — Telephone Encounter (Signed)
Spoke with pt's spouse, they have changed to [employer] and wants rx sent to [facility]. Rx sent to pharmacy, pt's spouse informed.

## 2012-05-29 ENCOUNTER — Other Ambulatory Visit: Payer: Self-pay | Admitting: Endocrinology

## 2012-05-29 MED ORDER — GLUCOSE BLOOD VI STRP: ORAL_STRIP | Status: DC

## 2012-05-29 MED ORDER — ONETOUCH LANCETS MISC: Status: DC

## 2012-05-29 NOTE — Telephone Encounter (Signed)
Pt needs refill on lancets and test strips for One Touch monitor sent to the [facility]

## 2012-11-20 ENCOUNTER — Other Ambulatory Visit: Payer: Self-pay | Admitting: Endocrinology

## 2012-11-20 ENCOUNTER — Other Ambulatory Visit: Payer: Self-pay | Admitting: *Deleted

## 2012-11-20 MED ORDER — METHIMAZOLE 10 MG PO TABS: 10.0000 mg | ORAL_TABLET | Freq: Three times a day (TID) | ORAL | Status: DC

## 2012-11-27 ENCOUNTER — Encounter: Payer: Self-pay | Admitting: Family Medicine

## 2012-11-27 ENCOUNTER — Ambulatory Visit (INDEPENDENT_AMBULATORY_CARE_PROVIDER_SITE_OTHER): Payer: 59 | Admitting: Family Medicine

## 2012-11-27 VITALS — BP 120/68 | HR 72 | Temp 98.6°F | Resp 16 | Ht 65.75 in | Wt 154.4 lb

## 2012-11-27 DIAGNOSIS — Z0000 Encounter for general adult medical examination without abnormal findings: Secondary | ICD-10-CM

## 2012-11-27 DIAGNOSIS — E119 Type 2 diabetes mellitus without complications: Secondary | ICD-10-CM

## 2012-11-27 NOTE — Patient Instructions (Addendum)
Follow up in 1 year or as needed We'll notify you of your lab results and make any changes if needed Keep up the good work!  You look great! Call with any questions or concerns Have a great summer!!! 

## 2012-11-27 NOTE — Assessment & Plan Note (Signed)
Pt's PE WNL.  Check labs.  Anticipatory guidance provided.  

## 2012-11-27 NOTE — Progress Notes (Signed)
  Subjective:    Patient ID: Christopher Mckenzie, male    DOB: [birth year], 42 y.o.   MRN: [id number]  HPI CPE- no concerns today.   Review of Systems Patient reports no vision/hearing changes, anorexia, fever ,adenopathy, persistant/recurrent hoarseness, swallowing issues, chest pain, palpitations, edema, persistant/recurrent cough, hemoptysis, dyspnea (rest,exertional, paroxysmal nocturnal), gastrointestinal  bleeding (melena, rectal bleeding), abdominal pain, excessive heart burn, GU symptoms (dysuria, hematuria, voiding/incontinence issues) syncope, focal weakness, memory loss, numbness & tingling, skin/hair/nail changes, depression, anxiety, abnormal bruising/bleeding, musculoskeletal symptoms/signs.     Objective:   Physical Exam BP 120/68  Pulse 72  Temp(Src) 98.6 F (37 C) (Oral)  Resp 16  Ht 5' 5.75" (1.67 m)  Wt 154 lb 6 oz (70.024 kg)  BMI 25.11 kg/m2  General Appearance:    Alert, cooperative, no distress, appears stated age  Head:    Normocephalic, without obvious abnormality, atraumatic  Eyes:    PERRL, conjunctiva/corneas clear, EOM's intact, fundi    benign, both eyes       Ears:    Normal TM's and external ear canals, both ears  Nose:   Nares normal, septum midline, mucosa normal, no drainage   or sinus tenderness  Throat:   Lips, mucosa, and tongue normal; teeth and gums normal  Neck:   Supple, symmetrical, trachea midline, no adenopathy;       thyroid:  No enlargement/tenderness/nodules  Back:     Symmetric, no curvature, ROM normal, no CVA tenderness  Lungs:     Clear to auscultation bilaterally, respirations unlabored  Chest wall:    No tenderness or deformity  Heart:    Regular rate and rhythm, S1 and S2 normal, no murmur, rub   or gallop  Abdomen:     Soft, non-tender, bowel sounds active all four quadrants,    no masses, no organomegaly  Genitalia:    Normal male without lesion, discharge or tenderness  Rectal:    Deferred due to young age  Extremities:    Extremities normal, atraumatic, no cyanosis or edema  Pulses:   2+ and symmetric all extremities  Skin:   Skin color, texture, turgor normal, no rashes or lesions  Lymph nodes:   Cervical, supraclavicular, and axillary nodes normal  Neurologic:   CNII-XII intact. Normal strength, sensation and reflexes      throughout          Assessment & Plan:

## 2012-11-28 ENCOUNTER — Telehealth: Payer: Self-pay | Admitting: *Deleted

## 2012-11-28 LAB — BASIC METABOLIC PANEL
CO2: 28 mEq/L (ref 19–32)
GFR: 84.44 mL/min (ref >60.00)
Glucose, Bld: 83 mg/dL (ref 70–99)
Potassium: 4 mEq/L (ref 3.5–5.1)
Sodium: 137 mEq/L (ref 135–145)

## 2012-11-28 LAB — CBC WITH DIFFERENTIAL/PLATELET
Eosinophils Relative: 3.6 % (ref 0.0–5.0)
Monocytes Absolute: 0.3 10*3/uL (ref 0.1–1.0)
Monocytes Relative: 5.4 % (ref 3.0–12.0)
Neutrophils Relative %: 55.4 % (ref 43.0–77.0)
Platelets: 206 10*3/uL (ref 150.0–400.0)
WBC: 5.4 10*3/uL (ref 4.5–10.5)

## 2012-11-28 LAB — LIPID PANEL
HDL: 43.8 mg/dL (ref >39.00)
Total CHOL/HDL Ratio: 4
Triglycerides: 201 mg/dL — ABNORMAL HIGH (ref 0.0–149.0)

## 2012-11-28 LAB — HEMOGLOBIN A1C: Hgb A1c MFr Bld: 5.5 % (ref 4.6–6.5)

## 2012-11-28 LAB — HEPATIC FUNCTION PANEL
AST: 22 U/L (ref 0–37)
Alkaline Phosphatase: 48 U/L (ref 39–117)
Bilirubin, Direct: 0.1 mg/dL (ref 0.0–0.3)
Total Protein: 6.7 g/dL (ref 6.0–8.3)

## 2012-11-28 LAB — LDL CHOLESTEROL, DIRECT: Direct LDL: 116.2 mg/dL

## 2012-11-28 NOTE — Telephone Encounter (Signed)
Spoke with the pt and informed him of recent lab results and note.  Pt understood but stated that he is taking the Methimazole 10mg  bid not tid.   Does the pt still need to decrease his Methimazole to 5mg  TID and repeat TSH.  Please advise.//AB/CMA

## 2012-11-28 NOTE — Telephone Encounter (Signed)
Message copied by Verdie Shire on Thu Nov 28, 2012  2:25 PM ------      Message from: Sheliah Hatch      Created: Thu Nov 28, 2012 12:55 PM       Labs look good w/ exception of TSH- now too high (was previously too low).  Needs to decrease his methimazole from 10mg  TID to 5 mg TID and repeat TSH in 6-8 weeks ------

## 2012-11-28 NOTE — Telephone Encounter (Signed)
No- should decrease to 5mg  BID and then recheck labs

## 2012-11-29 NOTE — Telephone Encounter (Signed)
Pt and wife both notified of the medication changes.

## 2013-01-16 ENCOUNTER — Encounter: Payer: Self-pay | Admitting: *Deleted

## 2013-01-16 ENCOUNTER — Other Ambulatory Visit (INDEPENDENT_AMBULATORY_CARE_PROVIDER_SITE_OTHER): Payer: 59

## 2013-01-16 DIAGNOSIS — E0590 Thyrotoxicosis, unspecified without thyrotoxic crisis or storm: Secondary | ICD-10-CM

## 2013-01-16 LAB — TSH: TSH: 2.09 u[IU]/mL (ref 0.35–5.50)

## 2013-01-29 ENCOUNTER — Other Ambulatory Visit: Payer: Self-pay | Admitting: Endocrinology

## 2013-05-06 ENCOUNTER — Other Ambulatory Visit: Payer: Self-pay | Admitting: Endocrinology

## 2013-05-12 ENCOUNTER — Ambulatory Visit (INDEPENDENT_AMBULATORY_CARE_PROVIDER_SITE_OTHER): Payer: 59 | Admitting: Endocrinology

## 2013-05-12 ENCOUNTER — Encounter: Payer: Self-pay | Admitting: Endocrinology

## 2013-05-12 VITALS — BP 110/70 | HR 87 | Temp 97.8°F | Ht 65.5 in | Wt 154.3 lb

## 2013-05-12 DIAGNOSIS — E119 Type 2 diabetes mellitus without complications: Secondary | ICD-10-CM

## 2013-05-12 DIAGNOSIS — E0590 Thyrotoxicosis, unspecified without thyrotoxic crisis or storm: Secondary | ICD-10-CM

## 2013-05-12 DIAGNOSIS — Z23 Encounter for immunization: Secondary | ICD-10-CM

## 2013-05-12 MED ORDER — METHIMAZOLE 10 MG PO TABS: ORAL_TABLET | ORAL | Status: DC

## 2013-05-12 NOTE — Patient Instructions (Signed)
blood tests are being requested for you today.  We'll contact you with results.   Please come back for a follow-up appointment in 6 months.   if ever you have fever while taking methimazole, stop it and call us, because of the risk of a rare side-effect check your blood sugar once a day.  vary the time of day when you check, between before the 3 meals, and at bedtime.  also check if you have symptoms of your blood sugar being too high or too low.  please keep a record of the readings and bring it to your next appointment here.  please call us sooner if your blood sugar goes below 70, or if it stays over 200.  With time, your blood sugar will go high again.

## 2013-05-12 NOTE — Progress Notes (Signed)
  Subjective:    Patient ID: Christopher Mckenzie, male    DOB: 1970-12-17, 42 y.o.   MRN: [id number]  HPI Pt returns for f/u of hyperthyroidism.  He chose thionamide rx, which he takes as rx'ed.  pt states he feels well in general.  He says he takes tapazole 10 mg qd. Pt alo returns for f/u of DM (dx'ed 2011, when he presented with weight loss; he has mild if any neuropathy of the lower extremities; no associated chronic complications).  He has not recently required any medication.  no cbg record, but states cbg's are well-controlled.   Past Medical History  Diagnosis Date  . DIABETES MELLITUS 08/29/2010  . HYPERTHYROIDISM 08/29/2010  . WEIGHT LOSS 08/26/2010  . ANOSMIA 05/13/2007    Past Surgical History  Procedure Laterality Date  . Vasectomy  07/2007    History   Social History  . Marital Status: Divorced    Spouse Name: N/A    Number of Children: 1  . Years of Education: N/A   Occupational History  .  [employer]   Social History Main Topics  . Smoking status: Never Smoker   . Smokeless tobacco: Not on file  . Alcohol Use: Yes     Comment: socially  . Drug Use: No  . Sexual Activity:    Other Topics Concern  . Not on file   Social History Narrative   Originally from [place], In [place] x many years   Regular Exercise-Yes   Married 1 child-2007    Current Outpatient Prescriptions on File Prior to Visit  Medication Sig Dispense Refill  . glucose blood (ONE TOUCH ULTRA TEST) test strip Once daily, dx 250.00  100 each  1  . ONE TOUCH LANCETS MISC USE ONE TOUCH DELI CO LANCETS AS DIRECTED TO CHECK BLOOD SUGAR ONCE DAILY. DIAG CODE:250.00  200 each  1   No current facility-administered medications on file prior to visit.   No Known Allergies  Family History  Problem Relation Age of Onset  . Diabetes Neg Hx   . Heart disease Neg Hx   . Kidney disease Mother     Kidney Cancer  . Cancer Mother     thyroid and kidney cancer   BP 110/70  Pulse 87  Temp(Src) 97.8 F (36.6 C)  (Oral)  Ht 5' 5.5" (1.664 m)  Wt 154 lb 4.8 oz (69.99 kg)  BMI 25.28 kg/m2  SpO2 97%  Review of Systems Denies fever and weight change    Objective:   Physical Exam VITAL SIGNS:  See vs page GENERAL: no distress NECK: There is no palpable thyroid enlargement.  No thyroid nodule is palpable.  No palpable lymphadenopathy at the anterior neck.    Lab Results  Component Value Date   TSH 0.09* 05/12/2013   Lab Results  Component Value Date   HGBA1C 5.8 05/12/2013      Assessment & Plan:  DM: well-controlled Hyperthyroidism: he needs increased rx. Noncompliance with f/u appts: this raises ? Of noncompliance with medication.  We'll follow this.

## 2013-05-13 LAB — HEMOGLOBIN A1C: Hgb A1c MFr Bld: 5.8 % (ref 4.6–6.5)

## 2013-05-19 ENCOUNTER — Other Ambulatory Visit: Payer: Self-pay | Admitting: Endocrinology

## 2013-05-19 MED ORDER — METHIMAZOLE 10 MG PO TABS: 10.0000 mg | ORAL_TABLET | Freq: Two times a day (BID) | ORAL | Status: DC

## 2014-02-09 ENCOUNTER — Telehealth: Payer: Self-pay

## 2014-02-09 NOTE — Telephone Encounter (Signed)
Pt has not been seen in 1 year.  Follow up appointment was scheduled so that form can be completed during.  Appointment scheduled for 02/11/14 @ 4pm with Dr. Birdie Riddle.

## 2014-02-11 ENCOUNTER — Encounter: Payer: Self-pay | Admitting: Family Medicine

## 2014-02-11 ENCOUNTER — Ambulatory Visit (INDEPENDENT_AMBULATORY_CARE_PROVIDER_SITE_OTHER): Payer: 59 | Admitting: Family Medicine

## 2014-02-11 VITALS — BP 120/80 | HR 70 | Temp 98.0°F | Resp 16 | Wt 150.0 lb

## 2014-02-11 DIAGNOSIS — Z0000 Encounter for general adult medical examination without abnormal findings: Secondary | ICD-10-CM

## 2014-02-11 NOTE — Patient Instructions (Signed)
Cancel your November appt Schedule your complete physical in 1 year- sooner if needed We'll notify you of your lab results and make any changes if needed Call with any questions or concerns Good luck w/ the adoption process!

## 2014-02-11 NOTE — Assessment & Plan Note (Signed)
Pt's PE WNL.  Check labs.  Anticipatory guidance provided.  

## 2014-02-11 NOTE — Progress Notes (Signed)
   Subjective:    Patient ID: Christopher Mckenzie, male    DOB: [birth year], 43 y.o.   MRN: [id number]  HPI CPE- no concerns   Review of Systems Patient reports no vision/hearing changes, anorexia, fever ,adenopathy, persistant/recurrent hoarseness, swallowing issues, chest pain, palpitations, edema, persistant/recurrent cough, hemoptysis, dyspnea (rest,exertional, paroxysmal nocturnal), gastrointestinal  bleeding (melena, rectal bleeding), abdominal pain, excessive heart burn, GU symptoms (dysuria, hematuria, voiding/incontinence issues) syncope, focal weakness, memory loss, numbness & tingling, skin/hair/nail changes, depression, anxiety, abnormal bruising/bleeding, musculoskeletal symptoms/signs.     Objective:   Physical Exam General Appearance:    Alert, cooperative, no distress, appears stated age  Head:    Normocephalic, without obvious abnormality, atraumatic  Eyes:    PERRL, conjunctiva/corneas clear, EOM's intact, fundi    benign, both eyes       Ears:    Normal TM's and external ear canals, both ears  Nose:   Nares normal, septum midline, mucosa normal, no drainage   or sinus tenderness  Throat:   Lips, mucosa, and tongue normal; teeth and gums normal  Neck:   Supple, symmetrical, trachea midline, no adenopathy;       thyroid:  No enlargement/tenderness/nodules  Back:     Symmetric, no curvature, ROM normal, no CVA tenderness  Lungs:     Clear to auscultation bilaterally, respirations unlabored  Chest wall:    No tenderness or deformity  Heart:    Regular rate and rhythm, S1 and S2 normal, no murmur, rub   or gallop  Abdomen:     Soft, non-tender, bowel sounds active all four quadrants,    no masses, no organomegaly  Genitalia:    Normal male without lesion, masses,discharge or tenderness  Rectal:    Deferred due to young age  Extremities:   Extremities normal, atraumatic, no cyanosis or edema  Pulses:   2+ and symmetric all extremities  Skin:   Skin color, texture, turgor  normal, no rashes or lesions  Lymph nodes:   Cervical, supraclavicular, and axillary nodes normal  Neurologic:   CNII-XII intact. Normal strength, sensation and reflexes      throughout          Assessment & Plan:

## 2014-02-11 NOTE — Progress Notes (Signed)
Pre visit review using our clinic review tool, if applicable. No additional management support is needed unless otherwise documented below in the visit note. 

## 2014-02-12 LAB — BASIC METABOLIC PANEL
BUN: 14 mg/dL (ref 6–23)
CHLORIDE: 104 meq/L (ref 96–112)
CO2: 28 meq/L (ref 19–32)
CREATININE: 1 mg/dL (ref 0.4–1.5)
Calcium: 9.3 mg/dL (ref 8.4–10.5)
GFR: 87.88 mL/min (ref >60.00)
Glucose, Bld: 88 mg/dL (ref 70–99)
Potassium: 4.1 mEq/L (ref 3.5–5.1)
Sodium: 138 mEq/L (ref 135–145)

## 2014-02-12 LAB — HEPATIC FUNCTION PANEL
ALT: 21 U/L (ref 0–53)
AST: 22 U/L (ref 0–37)
Albumin: 4.3 g/dL (ref 3.5–5.2)
Alkaline Phosphatase: 55 U/L (ref 39–117)
BILIRUBIN DIRECT: 0.1 mg/dL (ref 0.0–0.3)
Total Bilirubin: 0.6 mg/dL (ref 0.2–1.2)
Total Protein: 6.8 g/dL (ref 6.0–8.3)

## 2014-02-12 LAB — CBC WITH DIFFERENTIAL/PLATELET
BASOS ABS: 0 10*3/uL (ref 0.0–0.1)
Basophils Relative: 0.5 % (ref 0.0–3.0)
EOS ABS: 0.2 10*3/uL (ref 0.0–0.7)
Eosinophils Relative: 4.7 % (ref 0.0–5.0)
HCT: 39.6 % (ref 39.0–52.0)
Hemoglobin: 13.4 g/dL (ref 13.0–17.0)
Lymphocytes Relative: 33.8 % (ref 12.0–46.0)
Lymphs Abs: 1.8 10*3/uL (ref 0.7–4.0)
MCHC: 34 g/dL (ref 30.0–36.0)
MCV: 83.9 fl (ref 78.0–100.0)
MONO ABS: 0.4 10*3/uL (ref 0.1–1.0)
Monocytes Relative: 7 % (ref 3.0–12.0)
NEUTROS PCT: 54 % (ref 43.0–77.0)
Neutro Abs: 2.8 10*3/uL (ref 1.4–7.7)
PLATELETS: 223 10*3/uL (ref 150.0–400.0)
RBC: 4.72 Mil/uL (ref 4.22–5.81)
RDW: 13.4 % (ref 11.5–15.5)
WBC: 5.2 10*3/uL (ref 4.0–10.5)

## 2014-02-12 LAB — LIPID PANEL
Cholesterol: 182 mg/dL (ref 0–200)
HDL: 37.4 mg/dL — ABNORMAL LOW (ref >39.00)
NONHDL: 144.6
Total CHOL/HDL Ratio: 5
Triglycerides: 311 mg/dL — ABNORMAL HIGH (ref 0.0–149.0)
VLDL: 62.2 mg/dL — ABNORMAL HIGH (ref 0.0–40.0)

## 2014-02-12 LAB — LDL CHOLESTEROL, DIRECT: LDL DIRECT: 108.9 mg/dL

## 2014-02-12 LAB — TSH: TSH: 0.09 u[IU]/mL — ABNORMAL LOW (ref 0.35–4.50)

## 2014-02-16 ENCOUNTER — Telehealth: Payer: Self-pay | Admitting: General Practice

## 2014-02-16 NOTE — Telephone Encounter (Signed)
Called pt to verify that he seen the mychart message about scheduling an appt with Dr. Loanne Drilling and scheduling fasting labs.

## 2014-02-19 NOTE — Telephone Encounter (Signed)
Pt scheduled with Dr. Loanne Drilling.

## 2014-03-11 ENCOUNTER — Ambulatory Visit (INDEPENDENT_AMBULATORY_CARE_PROVIDER_SITE_OTHER): Payer: 59 | Admitting: Endocrinology

## 2014-03-11 ENCOUNTER — Encounter: Payer: Self-pay | Admitting: Endocrinology

## 2014-03-11 VITALS — BP 132/84 | HR 61 | Temp 97.1°F | Ht 65.5 in | Wt 152.0 lb

## 2014-03-11 DIAGNOSIS — E0590 Thyrotoxicosis, unspecified without thyrotoxic crisis or storm: Secondary | ICD-10-CM

## 2014-03-11 MED ORDER — METHIMAZOLE 10 MG PO TABS: 10.0000 mg | ORAL_TABLET | Freq: Three times a day (TID) | ORAL | Status: DC

## 2014-03-11 NOTE — Progress Notes (Signed)
Subjective:    Patient ID: Christopher Mckenzie, male    DOB: February 22, 1971, 43 y.o.   MRN: [id number]  HPI Pt returns for f/u of hyperthyroidism (dx'ed 5366; uncertain etiology; he has never had imaging; he chose thionamide rx).  He takes tapazole 10 mg bid.  He says he does not miss taking it.  Denies fever.   Pt also returns for f/u of DM (dx'ed 2011, when he presented with weight loss; no chronic complications; he has never been on medication for this, as glucose spontaneously normalized). Denies weight change. Past Medical History  Diagnosis Date  . DIABETES MELLITUS 08/29/2010  . HYPERTHYROIDISM 08/29/2010  . WEIGHT LOSS 08/26/2010  . ANOSMIA 05/13/2007    Past Surgical History  Procedure Laterality Date  . Vasectomy  07/2007    History   Social History  . Marital Status: Divorced    Spouse Name: N/A    Number of Children: 1  . Years of Education: N/A   Occupational History  .  [employer]   Social History Main Topics  . Smoking status: Never Smoker   . Smokeless tobacco: Not on file  . Alcohol Use: Yes     Comment: socially  . Drug Use: No  . Sexual Activity:    Other Topics Concern  . Not on file   Social History Narrative   Originally from [place] x many years   Regular Exercise-Yes   Married 1 child-2007    Current Outpatient Prescriptions on File Prior to Visit  Medication Sig Dispense Refill  . glucose blood (ONE TOUCH ULTRA TEST) test strip Once daily, dx 250.00  100 each  1  . ONE TOUCH LANCETS MISC USE ONE TOUCH DELI CO LANCETS AS DIRECTED TO CHECK BLOOD SUGAR ONCE DAILY. DIAG CODE:250.00  200 each  1   No current facility-administered medications on file prior to visit.    No Known Allergies  Family History  Problem Relation Age of Onset  . Diabetes Neg Hx   . Heart disease Neg Hx   . Kidney disease Mother     Kidney Cancer  . Cancer Mother     thyroid and kidney cancer    BP 132/84  Pulse 61  Temp(Src) 97.1 F (36.2 C) (Oral)  Ht 5' 5.5"  (1.664 m)  Wt 152 lb (68.947 kg)  BMI 24.90 kg/m2  SpO2 98%    Review of Systems Denies n/v/sob    Objective:   Physical Exam VITAL SIGNS:  See vs page GENERAL: no distress NECK: There is no palpable thyroid enlargement.  No thyroid nodule is palpable.  No palpable lymphadenopathy at the anterior neck. Pulses: dorsalis pedis intact bilat.   Feet: no deformity.  no edema Skin:  no ulcer on the feet.  normal color and temp. Neuro: sensation is intact to touch on the feet   Lab Results  Component Value Date   TSH 0.09* 02/11/2014   Lab Results  Component Value Date   HGBA1C 5.8 05/12/2013       Assessment & Plan:  DM: in remission, but it will recur at some point. Hyperthyroidism, persistent (? compliance).   Patient is advised the following: Patient Instructions  We'll recheck the a1c with your next blood tests. Please increase the methimazole. i have sent a prescription to your pharmacy.  You should check your blood sugar approx once a week.  Call if it is over 150.   With time the diabetes will come back, so it is  important that we keep track of it.   Please come back for a follow-up appointment in 6 weeks You should consider taking the radioactive iodine pill.  It works like this: You would first stop the methimazole, then: We would then check a thyroid "scan" (a special, but easy and painless type of thyroid x ray).  It works like this: you go to the x-ray department of the hospital to swallow a pill, which contains a miniscule amount of radiation.  You will not notice any symptoms from this.  You will go back to the x-ray department the next day, to lie down in front of a camera.  The results of this will be sent to me.   Based on the results, i hope to order for you a treatment pill of radioactive iodine.  Although it is a larger amount of radiation, you will again notice no symptoms from this.  The pill is gone from your body in a few days (during which you should  stay away from other people), but takes several months to work.  Therefore, please return here approximately 6-8 weeks after the treatment.  This treatment has been available for many years, and the only known side-effect is an underactive thyroid.  It is possible that i would eventually prescribe for you a thyroid hormone pill, which is very inexpensive.  You don't have to worry about side-effects of this thyroid hormone pill, because it is the same molecule your thyroid makes.

## 2014-03-11 NOTE — Patient Instructions (Addendum)
We'll recheck the a1c with your next blood tests. Please increase the methimazole. i have sent a prescription to your pharmacy.  You should check your blood sugar approx once a week.  Call if it is over 150.   With time the diabetes will come back, so it is important that we keep track of it.   Please come back for a follow-up appointment in 6 weeks You should consider taking the radioactive iodine pill.  It works like this: You would first stop the methimazole, then: We would then check a thyroid "scan" (a special, but easy and painless type of thyroid x ray).  It works like this: you go to the x-ray department of the hospital to swallow a pill, which contains a miniscule amount of radiation.  You will not notice any symptoms from this.  You will go back to the x-ray department the next day, to lie down in front of a camera.  The results of this will be sent to me.   Based on the results, i hope to order for you a treatment pill of radioactive iodine.  Although it is a larger amount of radiation, you will again notice no symptoms from this.  The pill is gone from your body in a few days (during which you should stay away from other people), but takes several months to work.  Therefore, please return here approximately 6-8 weeks after the treatment.  This treatment has been available for many years, and the only known side-effect is an underactive thyroid.  It is possible that i would eventually prescribe for you a thyroid hormone pill, which is very inexpensive.  You don't have to worry about side-effects of this thyroid hormone pill, because it is the same molecule your thyroid makes.

## 2014-03-17 ENCOUNTER — Telehealth: Payer: Self-pay | Admitting: Endocrinology

## 2014-03-17 NOTE — Telephone Encounter (Signed)
Patients wife called wanting to know if she has to pay a percentage after her insurance  She called the insurance and they had advised her to contact Dr. Cordelia Pen office for diagnosis codes  This is pertaining to the RAI treatment    Please advise   Thank you

## 2014-03-18 NOTE — Telephone Encounter (Signed)
Called pt's wife. And advised to call [facility] Billing phone number [phone]. Advised to call back if they should have any questions.

## 2014-03-23 ENCOUNTER — Telehealth: Payer: Self-pay

## 2014-03-23 DIAGNOSIS — E0590 Thyrotoxicosis, unspecified without thyrotoxic crisis or storm: Secondary | ICD-10-CM

## 2014-03-23 NOTE — Telephone Encounter (Signed)
Pt's wife called stating that the pt is ready to proceed with Radioactive Iodine treatment.  Please advise, Thanks!

## 2014-03-23 NOTE — Telephone Encounter (Signed)
Ok, first we need 2 things: D/c methimazole. Come in for blood tests. Then we can schedule

## 2014-03-24 NOTE — Telephone Encounter (Signed)
Requested call back to discuss.  

## 2014-03-30 NOTE — Telephone Encounter (Signed)
Pt's wife advised. Pt coming for labs on 04/03/2014.

## 2014-04-03 ENCOUNTER — Other Ambulatory Visit (INDEPENDENT_AMBULATORY_CARE_PROVIDER_SITE_OTHER): Payer: 59

## 2014-04-03 DIAGNOSIS — E0590 Thyrotoxicosis, unspecified without thyrotoxic crisis or storm: Secondary | ICD-10-CM

## 2014-04-03 LAB — T4, FREE: Free T4: 0.87 ng/dL (ref 0.60–1.60)

## 2014-04-03 LAB — TSH: TSH: 0.53 u[IU]/mL (ref 0.35–4.50)

## 2014-04-14 ENCOUNTER — Encounter: Payer: Self-pay | Admitting: Endocrinology

## 2014-04-14 ENCOUNTER — Ambulatory Visit (INDEPENDENT_AMBULATORY_CARE_PROVIDER_SITE_OTHER): Payer: 59 | Admitting: Endocrinology

## 2014-04-14 VITALS — BP 116/60 | HR 62 | Temp 98.4°F | Ht 65.5 in | Wt 151.0 lb

## 2014-04-14 DIAGNOSIS — E0500 Thyrotoxicosis with diffuse goiter without thyrotoxic crisis or storm: Secondary | ICD-10-CM

## 2014-04-14 NOTE — Progress Notes (Signed)
   Subjective:    Patient ID: Christopher Mckenzie, male    DOB: Dec 01, 1970, 43 y.o.   MRN: [id number]  HPI Pt returns for f/u of hyperthyroidism (dx'ed 1017; uncertain etiology; he has never had imaging; he chose thionamide rx).  He stopped his tapazole, in preparation for I-131 rx. Past Medical History  Diagnosis Date  . DIABETES MELLITUS 08/29/2010  . HYPERTHYROIDISM 08/29/2010  . WEIGHT LOSS 08/26/2010  . ANOSMIA 05/13/2007    Past Surgical History  Procedure Laterality Date  . Vasectomy  07/2007    History   Social History  . Marital Status: Divorced    Spouse Name: N/A    Number of Children: 1  . Years of Education: N/A   Occupational History  .  [employer]   Social History Main Topics  . Smoking status: Never Smoker   . Smokeless tobacco: Not on file  . Alcohol Use: Yes     Comment: socially  . Drug Use: No  . Sexual Activity:    Other Topics Concern  . Not on file   Social History Narrative   Originally from [place], In [place] x many years   Regular Exercise-Yes   Married 1 child-2007    Current Outpatient Prescriptions on File Prior to Visit  Medication Sig Dispense Refill  . glucose blood (ONE TOUCH ULTRA TEST) test strip Once daily, dx 250.00  100 each  1  . ONE TOUCH LANCETS MISC USE ONE TOUCH DELI CO LANCETS AS DIRECTED TO CHECK BLOOD SUGAR ONCE DAILY. DIAG CODE:250.00  200 each  1   No current facility-administered medications on file prior to visit.    No Known Allergies  Family History  Problem Relation Age of Onset  . Diabetes Neg Hx   . Heart disease Neg Hx   . Kidney disease Mother     Kidney Cancer  . Cancer Mother     thyroid and kidney cancer    BP 116/60  Pulse 62  Temp(Src) 98.4 F (36.9 C) (Oral)  Ht 5' 5.5" (1.664 m)  Wt 151 lb (68.493 kg)  BMI 24.74 kg/m2  SpO2 96%    Review of Systems Denies fever    Objective:   Physical Exam VITAL SIGNS:  See vs page GENERAL: no distress Neck: thyroid is approx twice normal size.  No  palpable nodule       Assessment & Plan:  Hyperthyroidism: He wants to wait on I-131 rx until next year, when he has better insurance.   Patient is advised the following: Patient Instructions  Please resume the methimazole. Please come back for a follow-up appointment in 3 months. If you want to do the radioactive iodine pill when you get your new insurance next year, please stop the methimazole a few weeks prior to this appointment. We'll check the a1c when you come in, also.

## 2014-04-14 NOTE — Patient Instructions (Addendum)
Please resume the methimazole. Please come back for a follow-up appointment in 3 months. If you want to do the radioactive iodine pill when you get your new insurance next year, please stop the methimazole a few weeks prior to this appointment. We'll check the a1c when you come in, also.

## 2014-05-06 ENCOUNTER — Ambulatory Visit: Payer: 59 | Admitting: Endocrinology

## 2014-05-07 ENCOUNTER — Encounter (HOSPITAL_COMMUNITY): Payer: 59

## 2014-05-08 ENCOUNTER — Encounter (HOSPITAL_COMMUNITY): Payer: 59

## 2014-05-20 ENCOUNTER — Encounter: Payer: 59 | Admitting: Family Medicine

## 2014-05-22 ENCOUNTER — Other Ambulatory Visit: Payer: 59

## 2014-07-29 ENCOUNTER — Ambulatory Visit (INDEPENDENT_AMBULATORY_CARE_PROVIDER_SITE_OTHER): Payer: BLUE CROSS/BLUE SHIELD | Admitting: Endocrinology

## 2014-07-29 ENCOUNTER — Encounter: Payer: Self-pay | Admitting: Endocrinology

## 2014-07-29 VITALS — BP 118/60 | HR 66 | Temp 98.1°F | Ht 65.5 in | Wt 157.0 lb

## 2014-07-29 DIAGNOSIS — E0500 Thyrotoxicosis with diffuse goiter without thyrotoxic crisis or storm: Secondary | ICD-10-CM

## 2014-07-29 NOTE — Progress Notes (Signed)
Subjective:    Patient ID: Christopher Mckenzie, male    DOB: [birth year], 44 y.o.   MRN: [id number]  HPI Pt returns for f/u of hyperthyroidism (dx'ed 3762; uncertain etiology; he has never had imaging; he chose thionamide rx).  He stopped his tapazole, in preparation for I-131 rx. He wants to wait to do the I-131 until next winter, as he needs to put more money on his [employer] card.   He has slight tremor of the hands, but no assoc fever. Pt also returns for f/u of DM (dx'ed 2011, when he presented with weight loss; no chronic complications; he has never been on medication for this, as glucose spontaneously normalized). Past Medical History  Diagnosis Date  . DIABETES MELLITUS 08/29/2010  . HYPERTHYROIDISM 08/29/2010  . WEIGHT LOSS 08/26/2010  . ANOSMIA 05/13/2007    Past Surgical History  Procedure Laterality Date  . Vasectomy  07/2007    History   Social History  . Marital Status: Divorced    Spouse Name: N/A    Number of Children: 1  . Years of Education: N/A   Occupational History  .  [employer]   Social History Main Topics  . Smoking status: Never Smoker   . Smokeless tobacco: Not on file  . Alcohol Use: Yes     Comment: socially  . Drug Use: No  . Sexual Activity: Not on file   Other Topics Concern  . Not on file   Social History Narrative   Originally from [place], [place] x many years   Regular Exercise-Yes   Married 1 child-2007    Current Outpatient Prescriptions on File Prior to Visit  Medication Sig Dispense Refill  . glucose blood (ONE TOUCH ULTRA TEST) test strip Once daily, dx 250.00 100 each 1  . methimazole (TAPAZOLE) 10 MG tablet Take 10 mg by mouth 3 (three) times daily.    . ONE TOUCH LANCETS MISC USE ONE TOUCH DELI CO LANCETS AS DIRECTED TO CHECK BLOOD SUGAR ONCE DAILY. DIAG CODE:250.00 200 each 1   No current facility-administered medications on file prior to visit.    No Known Allergies  Family History  Problem Relation Age of Onset  . Diabetes Father    . Heart disease Neg Hx   . Kidney disease Mother     Kidney Cancer  . Cancer Mother     thyroid and kidney cancer    BP 118/60 mmHg  Pulse 66  Temp(Src) 98.1 F (36.7 C) (Oral)  Ht 5' 5.5" (1.664 m)  Wt 157 lb (71.215 kg)  BMI 25.72 kg/m2  SpO2 98%  Review of Systems Denies weight change and excessive diaphoresis    Objective:   Physical Exam VITAL SIGNS:  See vs page.  GENERAL: no distress.  Neck: thyroid is approx twice normal size.  No palpable nodule.   LUNGS:  Clear to auscultation Pulses: dorsalis pedis intact bilat.   MSK: no deformity of the feet.  CV: no leg edema.  Skin:  no ulcer on the feet.  normal color and temp on the feet.  Neuro: sensation is intact to touch on the feet.     Lab Results  Component Value Date   HGBA1C 5.6 07/29/2014   Lab Results  Component Value Date   TSH 0.14* 07/29/2014      Assessment & Plan:  Hyperthyroidism: mild exacerbation DM: in remission.   Patient is advised the following: Patient Instructions  blood tests are being requested for you today.  We'll  let you know about the results. if ever you have fever while taking methimazole, stop it and call us, because of the risk of a rare side-effect. Please come back for a follow-up appointment in 3 months. If you want to do the radioactive iodine pill later this year. check your blood sugar once a week.  vary the time of day when you check, between before the 3 meals, and at bedtime.  also check if you have symptoms of your blood sugar being too high or too low.  please call us if your blood sugar goes over 200.  With time, your blood sugar will go up again.    addendum: Please continue the same medication.

## 2014-07-29 NOTE — Patient Instructions (Addendum)
blood tests are being requested for you today.  We'll let you know about the results. if ever you have fever while taking methimazole, stop it and call us, because of the risk of a rare side-effect. Please come back for a follow-up appointment in 3 months. If you want to do the radioactive iodine pill later this year. check your blood sugar once a week.  vary the time of day when you check, between before the 3 meals, and at bedtime.  also check if you have symptoms of your blood sugar being too high or too low.  please call us if your blood sugar goes over 200.  With time, your blood sugar will go up again.

## 2014-07-30 LAB — TSH: TSH: 0.14 u[IU]/mL — ABNORMAL LOW (ref 0.35–4.50)

## 2014-07-30 LAB — BASIC METABOLIC PANEL
BUN: 20 mg/dL (ref 6–23)
CALCIUM: 9.4 mg/dL (ref 8.4–10.5)
CHLORIDE: 104 meq/L (ref 96–112)
CO2: 24 mEq/L (ref 19–32)
CREATININE: 1.05 mg/dL (ref 0.40–1.50)
GFR: 81.93 mL/min (ref >60.00)
GLUCOSE: 92 mg/dL (ref 70–99)
POTASSIUM: 4.3 meq/L (ref 3.5–5.1)
SODIUM: 138 meq/L (ref 135–145)

## 2014-07-30 LAB — T4, FREE: Free T4: 1.12 ng/dL (ref 0.60–1.60)

## 2014-07-30 LAB — HEMOGLOBIN A1C: Hgb A1c MFr Bld: 5.6 % (ref 4.6–6.5)

## 2014-10-28 ENCOUNTER — Ambulatory Visit: Payer: BLUE CROSS/BLUE SHIELD | Admitting: Endocrinology

## 2014-11-26 ENCOUNTER — Other Ambulatory Visit: Payer: Self-pay | Admitting: Endocrinology

## 2014-12-28 ENCOUNTER — Other Ambulatory Visit: Payer: Self-pay

## 2015-08-06 ENCOUNTER — Ambulatory Visit (INDEPENDENT_AMBULATORY_CARE_PROVIDER_SITE_OTHER): Payer: BLUE CROSS/BLUE SHIELD | Admitting: Endocrinology

## 2015-08-06 ENCOUNTER — Other Ambulatory Visit: Payer: Self-pay

## 2015-08-06 ENCOUNTER — Encounter: Payer: Self-pay | Admitting: Endocrinology

## 2015-08-06 VITALS — BP 126/66 | HR 70 | Temp 98.1°F | Ht 65.5 in | Wt 161.0 lb

## 2015-08-06 DIAGNOSIS — E119 Type 2 diabetes mellitus without complications: Secondary | ICD-10-CM | POA: Diagnosis not present

## 2015-08-06 DIAGNOSIS — E0500 Thyrotoxicosis with diffuse goiter without thyrotoxic crisis or storm: Secondary | ICD-10-CM

## 2015-08-06 LAB — T4, FREE: FREE T4: 0.93 ng/dL (ref 0.60–1.60)

## 2015-08-06 LAB — TSH: TSH: 1.98 u[IU]/mL (ref 0.35–4.50)

## 2015-08-06 LAB — HEMOGLOBIN A1C: Hgb A1c MFr Bld: 5.5 % (ref 4.6–6.5)

## 2015-08-06 MED ORDER — METHIMAZOLE 10 MG PO TABS: 10.0000 mg | ORAL_TABLET | Freq: Three times a day (TID) | ORAL | Status: DC

## 2015-08-06 MED FILL — methIMAzole 10 MG TABS: 10 | 30 days supply | Qty: 90 | Fill #0

## 2015-08-06 NOTE — Progress Notes (Signed)
Subjective:    Patient ID: Christopher Mckenzie, male    DOB: 09-26-1970, 45 y.o.   MRN: [id number]  HPI Pt returns for f/u of hyperthyroidism (dx'ed [id number]; uncertain etiology; he has never had imaging; he chose thionamide rx; he declines I-131, due to cost).   Pt also returns for f/u of DM (dx'ed 2011, when he presented with weight loss; no chronic complications; he has never been on medication for this, as glucose spontaneously normalized). He has slight ringing in the right ear, but no assoc fever.  He takes tapazole TID as rx'ed.     Past Medical History  Diagnosis Date  . DIABETES MELLITUS 08/29/2010  . HYPERTHYROIDISM 08/29/2010  . WEIGHT LOSS 08/26/2010  . ANOSMIA 05/13/2007    Past Surgical History  Procedure Laterality Date  . Vasectomy  07/2007    Social History   Social History  . Marital Status: Divorced    Spouse Name: N/A  . Number of Children: 1  . Years of Education: N/A   Occupational History  .  [employer]   Social History Main Topics  . Smoking status: Never Smoker   . Smokeless tobacco: Not on file  . Alcohol Use: Yes     Comment: socially  . Drug Use: No  . Sexual Activity: Not on file   Other Topics Concern  . Not on file   Social History Narrative   Originally from [place], [place] x many years   Regular Exercise-Yes   Married 1 child-2007    Current Outpatient Prescriptions on File Prior to Visit  Medication Sig Dispense Refill  . glucose blood (ONE TOUCH ULTRA TEST) test strip Once daily, dx 250.00 (Patient taking differently: 1 each by Other route once a week. And lancets 1/week) 100 each 1   No current facility-administered medications on file prior to visit.    No Known Allergies  Family History  Problem Relation Age of Onset  . Diabetes Father   . Heart disease Neg Hx   . Kidney disease Mother     Kidney Cancer  . Cancer Mother     thyroid and kidney cancer    BP 126/66 mmHg  Pulse 70  Temp(Src) 98.1 F (36.7 C) (Oral)  Ht 5' 5.5"  (1.664 m)  Wt 161 lb (73.029 kg)  BMI 26.37 kg/m2  SpO2 98%  Review of Systems Denies weight change and palpitations.    Objective:   Physical Exam VITAL SIGNS:  See vs page GENERAL: no distress right eac and tm are normal.   Neck: thyroid is approx twice normal size.  No palpable nodule.  Lab Results  Component Value Date   HGBA1C 5.5 08/06/2015   Lab Results  Component Value Date   TSH 1.98 08/06/2015      Assessment & Plan:  Hyperthyroidism: well-controlled.  Please continue the same medication.   DM: still in remission.  We'll follow.   Ear sxs, new. I told pt this is not related to the above.    Patient is advised the following: Patient Instructions  blood tests are requested for you today.  We'll let you know about the results. Please come back for a follow-up appointment in 6 months. check your blood sugar once a week.  vary the time of day when you check, between before the 3 meals, and at bedtime.  also check if you have symptoms of your blood sugar being too high or too low.  please call us if your blood sugar goes  over 200.  With time, your blood sugar will go up again.

## 2015-08-06 NOTE — Patient Instructions (Signed)
blood tests are requested for you today.  We'll let you know about the results. Please come back for a follow-up appointment in 6 months. check your blood sugar once a week.  vary the time of day when you check, between before the 3 meals, and at bedtime.  also check if you have symptoms of your blood sugar being too high or too low.  please call us if your blood sugar goes over 200.  With time, your blood sugar will go up again.

## 2015-08-07 MED ORDER — METHIMAZOLE 10 MG PO TABS: 10.0000 mg | ORAL_TABLET | Freq: Three times a day (TID) | ORAL | Status: DC

## 2015-10-07 MED FILL — methIMAzole 10 MG TABS: 10 | 90 days supply | Qty: 270 | Fill #0

## 2016-01-28 ENCOUNTER — Ambulatory Visit: Payer: BLUE CROSS/BLUE SHIELD | Admitting: Endocrinology

## 2016-02-02 DIAGNOSIS — L219 Seborrheic dermatitis, unspecified: Secondary | ICD-10-CM | POA: Diagnosis not present

## 2016-02-02 DIAGNOSIS — D225 Melanocytic nevi of trunk: Secondary | ICD-10-CM | POA: Diagnosis not present

## 2016-02-02 DIAGNOSIS — D485 Neoplasm of uncertain behavior of skin: Secondary | ICD-10-CM | POA: Diagnosis not present

## 2016-02-02 DIAGNOSIS — L570 Actinic keratosis: Secondary | ICD-10-CM | POA: Diagnosis not present

## 2016-02-14 MED FILL — HYDROCORTISONE 2.5% CREAM: 2.5 | 20 days supply | Qty: 30 | Fill #0

## 2016-02-23 DIAGNOSIS — L570 Actinic keratosis: Secondary | ICD-10-CM | POA: Diagnosis not present

## 2016-02-23 MED FILL — FLUOROURACIL 5% CREAM: 5 | 30 days supply | Qty: 40 | Fill #0

## 2016-02-28 MED FILL — methIMAzole 10 MG TABS: 10 | 90 days supply | Qty: 270 | Fill #1

## 2016-07-07 ENCOUNTER — Other Ambulatory Visit: Payer: Self-pay | Admitting: Endocrinology

## 2016-07-08 NOTE — Telephone Encounter (Signed)
Please refill x 1 Ov is due  

## 2016-07-10 MED FILL — methIMAzole 10 MG TABS: 10 | 90 days supply | Qty: 270 | Fill #0

## 2016-08-02 DIAGNOSIS — L821 Other seborrheic keratosis: Secondary | ICD-10-CM | POA: Diagnosis not present

## 2016-08-02 DIAGNOSIS — D225 Melanocytic nevi of trunk: Secondary | ICD-10-CM | POA: Diagnosis not present

## 2016-08-02 DIAGNOSIS — L814 Other melanin hyperpigmentation: Secondary | ICD-10-CM | POA: Diagnosis not present

## 2016-08-02 DIAGNOSIS — L905 Scar conditions and fibrosis of skin: Secondary | ICD-10-CM | POA: Diagnosis not present

## 2016-11-13 ENCOUNTER — Ambulatory Visit (INDEPENDENT_AMBULATORY_CARE_PROVIDER_SITE_OTHER): Payer: BLUE CROSS/BLUE SHIELD | Admitting: Endocrinology

## 2016-11-13 ENCOUNTER — Encounter: Payer: Self-pay | Admitting: Endocrinology

## 2016-11-13 VITALS — BP 102/60 | HR 72 | Ht 66.0 in | Wt 151.0 lb

## 2016-11-13 DIAGNOSIS — E119 Type 2 diabetes mellitus without complications: Secondary | ICD-10-CM | POA: Diagnosis not present

## 2016-11-13 DIAGNOSIS — E0500 Thyrotoxicosis with diffuse goiter without thyrotoxic crisis or storm: Secondary | ICD-10-CM

## 2016-11-13 LAB — POCT GLYCOSYLATED HEMOGLOBIN (HGB A1C): HEMOGLOBIN A1C: 5.4

## 2016-11-13 LAB — T4, FREE: Free T4: 0.75 ng/dL (ref 0.60–1.60)

## 2016-11-13 LAB — TSH: TSH: 5.36 u[IU]/mL — AB (ref 0.35–4.50)

## 2016-11-13 MED ORDER — METHIMAZOLE 10 MG PO TABS: 10.0000 mg | ORAL_TABLET | Freq: Two times a day (BID) | ORAL | 2 refills | Status: DC

## 2016-11-13 NOTE — Patient Instructions (Signed)
blood tests are requested for you today.  We'll let you know about the results. No medication is needed for diabetes now.  Please come back for a follow-up appointment in 6 months. check your blood sugar once a week.  vary the time of day when you check, between before the 3 meals, and at bedtime.  also check if you have symptoms of your blood sugar being too high or too low.  please call us if your blood sugar goes over 200.  With time, your blood sugar will go up again.

## 2016-11-13 NOTE — Progress Notes (Signed)
   Subjective:    Patient ID: Christopher Mckenzie, male    DOB: [birth year], 46 y.o.   MRN: [id number]  HPI  Pt returns for f/u of diabetes mellitus: DM type: (1 is dx'ed due to [place] and lean body habitus); it is in remission Dx'ed: 1517 Complications: none Therapy: no medication DKA: never Severe hypoglycemia: never Pancreatitis: never Other: he has never been on medication for this, as glucose spontaneously normalized Interval history: pt states he feels well in general.   He has not recently checked cbg.   Pt returns for f/u of hyperthyroidism (dx'ed 6160; uncertain etiology; he has never had imaging; he chose thionamide rx; he declines RAI, due to cost).   Past Medical History:  Diagnosis Date  . ANOSMIA 05/13/2007  . DIABETES MELLITUS 08/29/2010  . HYPERTHYROIDISM 08/29/2010  . WEIGHT LOSS 08/26/2010    Past Surgical History:  Procedure Laterality Date  . VASECTOMY  07/2007    Social History   Social History  . Marital status: Divorced    Spouse name: N/A  . Number of children: 1  . Years of education: N/A   Occupational History  .  [employer]   Social History Main Topics  . Smoking status: Never Smoker  . Smokeless tobacco: Never Used  . Alcohol use Yes     Comment: socially  . Drug use: No  . Sexual activity: Not on file   Other Topics Concern  . Not on file   Social History Narrative   Originally from [place], [place] x many years   Regular Exercise-Yes   Married 1 child-2007    Current Outpatient Prescriptions on File Prior to Visit  Medication Sig Dispense Refill  . glucose blood (ONE TOUCH ULTRA TEST) test strip Once daily, dx 250.00 (Patient taking differently: 1 each by Other route once a week. And lancets 1/week) 100 each 1   No current facility-administered medications on file prior to visit.     No Known Allergies  Family History  Problem Relation Age of Onset  . Diabetes Father   . Heart disease Neg Hx   . Kidney disease Mother    Kidney Cancer  . Cancer Mother        thyroid and kidney cancer    BP 102/60   Pulse 72   Ht 5\' 6"  (1.676 m)   Wt 151 lb (68.5 kg)   SpO2 97%   BMI 24.37 kg/m    Review of Systems Denies fever.      Objective:   Physical Exam VITAL SIGNS:  See vs page GENERAL: no distress EYES: no proptosis Neck: thyroid is slightly and diffusely enlarged.  No palpable nodule.   A1c=5.4% Lab Results  Component Value Date   TSH 5.36 (H) 11/13/2016      Assessment & Plan:  Hyperthyroidism: slightly overcontrolled.  Reduce tapazole to 10-BID DM: still in remission.  We'll follow.

## 2016-11-14 ENCOUNTER — Ambulatory Visit: Payer: BLUE CROSS/BLUE SHIELD | Admitting: Endocrinology

## 2016-11-14 MED FILL — methIMAzole 10 MG TABS: 10 | 30 days supply | Qty: 60 | Fill #0

## 2016-12-14 MED FILL — methIMAzole 10 MG TABS: 10 | 30 days supply | Qty: 60 | Fill #1

## 2017-01-30 MED FILL — methIMAzole 10 MG TABS: 10 | 30 days supply | Qty: 60 | Fill #2

## 2017-03-13 ENCOUNTER — Telehealth: Payer: Self-pay | Admitting: Endocrinology

## 2017-03-13 ENCOUNTER — Other Ambulatory Visit: Payer: Self-pay

## 2017-03-13 MED ORDER — METHIMAZOLE 10 MG PO TABS: 10.0000 mg | ORAL_TABLET | Freq: Two times a day (BID) | ORAL | 2 refills | Status: DC

## 2017-03-13 NOTE — Telephone Encounter (Signed)
Patient wants to change pharmacies: Please send the methimazole (TAPAZOLE) 10 MG tablet to  [id number] [address], [place] [phone] (Phone) [phone] (Fax)   Patient is completely out of the medication and needs a 90 day supply.

## 2017-03-14 ENCOUNTER — Other Ambulatory Visit: Payer: Self-pay

## 2017-03-14 MED ORDER — METHIMAZOLE 10 MG PO TABS
10.0000 mg | ORAL_TABLET | Freq: Two times a day (BID) | ORAL | 2 refills | Status: DC
Start: 2017-03-14 — End: 2018-04-30

## 2017-04-10 ENCOUNTER — Encounter: Payer: Self-pay | Admitting: Physician Assistant

## 2017-04-10 ENCOUNTER — Ambulatory Visit (INDEPENDENT_AMBULATORY_CARE_PROVIDER_SITE_OTHER): Payer: BLUE CROSS/BLUE SHIELD | Admitting: Physician Assistant

## 2017-04-10 ENCOUNTER — Ambulatory Visit (HOSPITAL_BASED_OUTPATIENT_CLINIC_OR_DEPARTMENT_OTHER)
Admission: RE | Admit: 2017-04-10 | Discharge: 2017-04-10 | Disposition: A | Discharge status: Home / Self Care | Payer: BLUE CROSS/BLUE SHIELD | Source: Ambulatory Visit | Attending: Physician Assistant | Admitting: Physician Assistant

## 2017-04-10 VITALS — BP 100/60 | HR 70 | Temp 98.2°F | Resp 14 | Ht 66.0 in | Wt 153.0 lb

## 2017-04-10 DIAGNOSIS — S6991XA Unspecified injury of right wrist, hand and finger(s), initial encounter: Secondary | ICD-10-CM | POA: Diagnosis not present

## 2017-04-10 DIAGNOSIS — X58XXXA Exposure to other specified factors, initial encounter: Secondary | ICD-10-CM | POA: Diagnosis not present

## 2017-04-10 MED ORDER — MELOXICAM 15 MG PO TABS: 15.0000 mg | ORAL_TABLET | Freq: Every day | ORAL | 0 refills | Status: DC

## 2017-04-10 NOTE — Progress Notes (Signed)
Pre visit review using our clinic review tool, if applicable. No additional management support is needed unless otherwise documented below in the visit note. 

## 2017-04-10 NOTE — Patient Instructions (Signed)
Please speak with Christopher Mckenzie for directions for imaging.  I will call with results.  Start the Meloxicam. Avoid heavy lifting. Let me know if symptoms are not improving.

## 2017-04-10 NOTE — Progress Notes (Signed)
   Patient presents to clinic today c/o pain in R 4th phalanx x 3 weeks , described as aching and swollen. Notes finger getting caught on an object which pulled it to the side. Pain has been present ever since. Noted some bruising that has resolved. Denies numbness, tingling.   Past Medical History:  Diagnosis Date  . ANOSMIA 05/13/2007  . DIABETES MELLITUS 08/29/2010  . HYPERTHYROIDISM 08/29/2010  . WEIGHT LOSS 08/26/2010    Current Outpatient Prescriptions on File Prior to Visit  Medication Sig Dispense Refill  . methimazole (TAPAZOLE) 10 MG tablet Take 1 tablet (10 mg total) by mouth 2 (two) times daily. 180 tablet 2   No current facility-administered medications on file prior to visit.     No Known Allergies  Family History  Problem Relation Age of Onset  . Kidney disease Mother        Kidney Cancer  . Cancer Mother        thyroid and kidney cancer  . Diabetes Father   . Heart disease Neg Hx     Social History   Social History  . Marital status: Divorced    Spouse name: N/A  . Number of children: 1  . Years of education: N/A   Occupational History  .  [employer]   Social History Main Topics  . Smoking status: Never Smoker  . Smokeless tobacco: Never Used  . Alcohol use Yes     Comment: socially  . Drug use: No  . Sexual activity: Not Asked   Other Topics Concern  . None   Social History Narrative   Originally from [place] x many years   Regular Exercise-Yes   Married 1 child-2007    Review of Systems - See HPI.  All other ROS are negative.  BP 100/60   Pulse 70   Temp 98.2 F (36.8 C) (Oral)   Resp 14   Ht 5\' 6"  (1.676 m)   Wt 153 lb (69.4 kg)   SpO2 98%   BMI 24.69 kg/m   Physical Exam  Constitutional: He is oriented to person, place, and time and well-developed, well-nourished, and in no distress.  HENT:  Head: Normocephalic and atraumatic.  Eyes: Conjunctivae are normal.  Neck: Neck supple.  Cardiovascular: Normal rate, regular  rhythm, normal heart sounds and intact distal pulses.   Pulmonary/Chest: Effort normal and breath sounds normal. No respiratory distress. He has no wheezes. He has no rales. He exhibits no tenderness.  Musculoskeletal:       Right hand: He exhibits normal range of motion and no tenderness.       Hands: Neurological: He is alert and oriented to person, place, and time. He has normal sensation and normal strength.  Skin: Skin is warm and dry. No rash noted.  Psychiatric: Affect normal.  Vitals reviewed.    Assessment/Plan: 1. Finger injury, right, initial encounter X-ray to rule out fracture. Likely tendon injury. Start Mobic for pain and inflammation. Supportive measures reviewed. Will alter regimen based on x-ray results.  - DG Hand Complete Right; Future - meloxicam (MOBIC) 15 MG tablet; Take 1 tablet (15 mg total) by mouth daily.  Dispense: 30 tablet; Refill: 0   Leeanne Rio, [place]

## 2017-04-13 ENCOUNTER — Encounter: Payer: Self-pay | Admitting: Emergency Medicine

## 2017-04-17 ENCOUNTER — Encounter: Payer: Self-pay | Admitting: Emergency Medicine

## 2017-05-16 ENCOUNTER — Ambulatory Visit: Payer: BLUE CROSS/BLUE SHIELD | Admitting: Endocrinology

## 2017-06-11 ENCOUNTER — Ambulatory Visit: Payer: BLUE CROSS/BLUE SHIELD | Admitting: Family Medicine

## 2017-06-13 ENCOUNTER — Other Ambulatory Visit: Payer: Self-pay

## 2017-06-13 ENCOUNTER — Ambulatory Visit (INDEPENDENT_AMBULATORY_CARE_PROVIDER_SITE_OTHER): Payer: BLUE CROSS/BLUE SHIELD | Admitting: Physician Assistant

## 2017-06-13 ENCOUNTER — Encounter: Payer: Self-pay | Admitting: Physician Assistant

## 2017-06-13 VITALS — BP 100/62 | HR 65 | Temp 98.0°F | Resp 14 | Ht 66.0 in | Wt 155.0 lb

## 2017-06-13 DIAGNOSIS — E0500 Thyrotoxicosis with diffuse goiter without thyrotoxic crisis or storm: Secondary | ICD-10-CM

## 2017-06-13 DIAGNOSIS — Z0000 Encounter for general adult medical examination without abnormal findings: Secondary | ICD-10-CM | POA: Diagnosis not present

## 2017-06-13 NOTE — Assessment & Plan Note (Signed)
Depression screen negative. Health Maintenance reviewed -- Flu shot up-to-date. Declines tetanus. Preventive schedule discussed and handout given in AVS. Will obtain fasting labs today.

## 2017-06-13 NOTE — Patient Instructions (Signed)
Please go to the lab for blood work.   Our office will call you with your results unless you have chosen to receive results via MyChart.  If your blood work is normal we will follow-up each year for physicals and as scheduled for chronic medical problems.  If anything is abnormal we will treat accordingly and get you in for a follow-up.  Please follow-up with Dr. Loanne Drilling as scheduled.   Preventive Care 40-64 Years, Male Preventive care refers to lifestyle choices and visits with your health care provider that can promote health and wellness. What does preventive care include?  A yearly physical exam. This is also called an annual well check.  Dental exams once or twice a year.  Routine eye exams. Ask your health care provider how often you should have your eyes checked.  Personal lifestyle choices, including: ? Daily care of your teeth and gums. ? Regular physical activity. ? Eating a healthy diet. ? Avoiding tobacco and drug use. ? Limiting alcohol use. ? Practicing safe sex. ? Taking low-dose aspirin every day starting at age 63. What happens during an annual well check? The services and screenings done by your health care provider during your annual well check will depend on your age, overall health, lifestyle risk factors, and family history of disease. Counseling Your health care provider may ask you questions about your:  Alcohol use.  Tobacco use.  Drug use.  Emotional well-being.  Home and relationship well-being.  Sexual activity.  Eating habits.  Work and work Statistician.  Screening You may have the following tests or measurements:  Height, weight, and BMI.  Blood pressure.  Lipid and cholesterol levels. These may be checked every 5 years, or more frequently if you are over 51 years old.  Skin check.  Lung cancer screening. You may have this screening every year starting at age 43 if you have a 30-pack-year history of smoking and currently smoke  or have quit within the past 15 years.  Fecal occult blood test (FOBT) of the stool. You may have this test every year starting at age 18.  Flexible sigmoidoscopy or colonoscopy. You may have a sigmoidoscopy every 5 years or a colonoscopy every 10 years starting at age 90.  Prostate cancer screening. Recommendations will vary depending on your family history and other risks.  Hepatitis C blood test.  Hepatitis B blood test.  Sexually transmitted disease (STD) testing.  Diabetes screening. This is done by checking your blood sugar (glucose) after you have not eaten for a while (fasting). You may have this done every 1-3 years.  Discuss your test results, treatment options, and if necessary, the need for more tests with your health care provider. Vaccines Your health care provider may recommend certain vaccines, such as:  Influenza vaccine. This is recommended every year.  Tetanus, diphtheria, and acellular pertussis (Tdap, Td) vaccine. You may need a Td booster every 10 years.  Varicella vaccine. You may need this if you have not been vaccinated.  Zoster vaccine. You may need this after age 69.  Measles, mumps, and rubella (MMR) vaccine. You may need at least one dose of MMR if you were born in 1957 or later. You may also need a second dose.  Pneumococcal 13-valent conjugate (PCV13) vaccine. You may need this if you have certain conditions and have not been vaccinated.  Pneumococcal polysaccharide (PPSV23) vaccine. You may need one or two doses if you smoke cigarettes or if you have certain conditions.  Meningococcal vaccine.  You may need this if you have certain conditions.  Hepatitis A vaccine. You may need this if you have certain conditions or if you travel or work in places where you may be exposed to hepatitis A.  Hepatitis B vaccine. You may need this if you have certain conditions or if you travel or work in places where you may be exposed to hepatitis B.  Haemophilus  influenzae type b (Hib) vaccine. You may need this if you have certain risk factors.  Talk to your health care provider about which screenings and vaccines you need and how often you need them. This information is not intended to replace advice given to you by your health care provider. Make sure you discuss any questions you have with your health care provider. Document Released: 07/16/2015 Document Revised: 03/08/2016 Document Reviewed: 04/20/2015 Elsevier Interactive Patient Education  2017 [employer]. .

## 2017-06-13 NOTE — Assessment & Plan Note (Signed)
Followed by Endo. Continue care as directed by specialist.

## 2017-06-13 NOTE — Progress Notes (Signed)
Patient presents to clinic today for annual exam.  Patient is fasting for labs. Diet -- Endorses well-balanced overall. Exercise -- Exercises 1-2 x week. Stays active at job.  Acute Concerns: Denies acute concerns today. Patient is followed by Endocrinology for his hyperthyroidism. Is taking Methimazole as directed.  Health Maintenance: Immunizations -- Flu shot.   Past Medical History:  Diagnosis Date  . ANOSMIA 05/13/2007  . DIABETES MELLITUS 08/29/2010  . HYPERTHYROIDISM 08/29/2010  . WEIGHT LOSS 08/26/2010    Past Surgical History:  Procedure Laterality Date  . VASECTOMY  07/2007    Current Outpatient Medications on File Prior to Visit  Medication Sig Dispense Refill  . methimazole (TAPAZOLE) 10 MG tablet Take 1 tablet (10 mg total) by mouth 2 (two) times daily. 180 tablet 2   No current facility-administered medications on file prior to visit.     No Known Allergies  Family History  Problem Relation Age of Onset  . Kidney disease Mother        Kidney Cancer  . Cancer Mother        thyroid and kidney cancer  . Diabetes Father   . Heart disease Neg Hx     Social History   Socioeconomic History  . Marital status: Divorced    Spouse name: Not on file  . Number of children: 1  . Years of education: Not on file  . Highest education level: Not on file  Social Needs  . Financial resource strain: Not on file  . Food insecurity - worry: Not on file  . Food insecurity - inability: Not on file  . Transportation needs - medical: Not on file  . Transportation needs - non-medical: Not on file  Occupational History    Employer: [employer]  Tobacco Use  . Smoking status: Never Smoker  . Smokeless tobacco: Never Used  Substance and Sexual Activity  . Alcohol use: No    Frequency: Never  . Drug use: No  . Sexual activity: Yes  Other Topics Concern  . Not on file  Social History Narrative   Originally from [place], In [place] x many years   Regular Exercise-Yes   Married 1 child-2007   Review of Systems  Constitutional: Negative for fever and weight loss.  HENT: Negative for ear discharge, ear pain, hearing loss and tinnitus.   Eyes: Negative for blurred vision, double vision, photophobia and pain.  Respiratory: Negative for cough and shortness of breath.   Cardiovascular: Negative for chest pain and palpitations.  Gastrointestinal: Negative for abdominal pain, blood in stool, constipation, diarrhea, heartburn, melena, nausea and vomiting.  Genitourinary: Negative for dysuria, flank pain, frequency, hematuria and urgency.  Musculoskeletal: Negative for falls.  Neurological: Negative for dizziness, loss of consciousness and headaches.  Endo/Heme/Allergies: Negative for environmental allergies.  Psychiatric/Behavioral: Negative for depression, hallucinations, substance abuse and suicidal ideas. The patient is not nervous/anxious and does not have insomnia.    BP 100/62   Pulse 65   Temp 98 F (36.7 C) (Oral)   Resp 14   Ht 5\' 6"  (1.676 m)   Wt 155 lb (70.3 kg)   SpO2 98%   BMI 25.02 kg/m   Physical Exam  Constitutional: He is oriented to person, place, and time and well-developed, well-nourished, and in no distress.  HENT:  Head: Normocephalic and atraumatic.  Right Ear: External ear normal.  Left Ear: External ear normal.  Nose: Nose normal.  Mouth/Throat: Oropharynx is clear and moist. No oropharyngeal exudate.  Eyes:  Conjunctivae and EOM are normal. Pupils are equal, round, and reactive to light.  Neck: Neck supple. No thyromegaly present.  Cardiovascular: Normal rate, regular rhythm, normal heart sounds and intact distal pulses.  Pulmonary/Chest: Effort normal and breath sounds normal. No respiratory distress. He has no wheezes. He has no rales. He exhibits no tenderness.  Abdominal: Soft. Bowel sounds are normal. He exhibits no distension and no mass. There is no tenderness. There is no rebound and no guarding.  Genitourinary:  Testes/scrotum normal.  Lymphadenopathy:    He has no cervical adenopathy.  Neurological: He is alert and oriented to person, place, and time.  Skin: Skin is warm and dry. No rash noted.  Psychiatric: Affect normal.  Vitals reviewed.  Assessment/Plan: Thyrotoxicosis Followed by Endo. Continue care as directed by specialist.  Visit for preventive health examination Depression screen negative. Health Maintenance reviewed -- Flu shot up-to-date. Declines tetanus. Preventive schedule discussed and handout given in AVS. Will obtain fasting labs today.       Leeanne Rio, PA-C

## 2017-06-14 ENCOUNTER — Encounter: Payer: Self-pay | Admitting: Emergency Medicine

## 2017-06-14 LAB — CBC WITH DIFFERENTIAL/PLATELET
BASOS PCT: 0.8 %
Basophils Absolute: 51 cells/uL (ref 0–200)
EOS ABS: 346 {cells}/uL (ref 15–500)
Eosinophils Relative: 5.4 %
HCT: 40.1 % (ref 38.5–50.0)
Hemoglobin: 13.8 g/dL (ref 13.2–17.1)
Lymphs Abs: 2304 cells/uL (ref 850–3900)
MCH: 28.5 pg (ref 27.0–33.0)
MCHC: 34.4 g/dL (ref 32.0–36.0)
MCV: 82.9 fL (ref 80.0–100.0)
MPV: 10 fL (ref 7.5–12.5)
Monocytes Relative: 6.7 %
NEUTROS PCT: 51.1 %
Neutro Abs: 3270 cells/uL (ref 1500–7800)
PLATELETS: 242 10*3/uL (ref 140–400)
RBC: 4.84 10*6/uL (ref 4.20–5.80)
RDW: 13 % (ref 11.0–15.0)
TOTAL LYMPHOCYTE: 36 %
WBC: 6.4 10*3/uL (ref 3.8–10.8)
WBCMIX: 429 {cells}/uL (ref 200–950)

## 2017-06-14 LAB — COMPREHENSIVE METABOLIC PANEL
AG RATIO: 2 (calc) (ref 1.0–2.5)
ALKALINE PHOSPHATASE (APISO): 54 U/L (ref 40–115)
ALT: 21 U/L (ref 9–46)
AST: 21 U/L (ref 10–40)
Albumin: 4.6 g/dL (ref 3.6–5.1)
BUN: 20 mg/dL (ref 7–25)
CHLORIDE: 103 mmol/L (ref 98–110)
CO2: 29 mmol/L (ref 20–32)
CREATININE: 0.89 mg/dL (ref 0.60–1.35)
Calcium: 9.7 mg/dL (ref 8.6–10.3)
GLOBULIN: 2.3 g/dL (ref 1.9–3.7)
Glucose, Bld: 90 mg/dL (ref 65–99)
POTASSIUM: 4.4 mmol/L (ref 3.5–5.3)
SODIUM: 138 mmol/L (ref 135–146)
Total Bilirubin: 0.6 mg/dL (ref 0.2–1.2)
Total Protein: 6.9 g/dL (ref 6.1–8.1)

## 2017-06-14 LAB — LIPID PANEL
CHOL/HDL RATIO: 3.7 (calc) (ref <5.0)
Cholesterol: 178 mg/dL (ref <200)
HDL: 48 mg/dL (ref >40)
LDL Cholesterol (Calc): 108 mg/dL (calc) — ABNORMAL HIGH
NON-HDL CHOLESTEROL (CALC): 130 mg/dL — AB (ref <130)
Triglycerides: 117 mg/dL (ref <150)

## 2017-11-27 ENCOUNTER — Telehealth: Payer: Self-pay | Admitting: Family Medicine

## 2017-11-27 NOTE — Telephone Encounter (Signed)
Paper work has been faxed

## 2017-11-27 NOTE — Telephone Encounter (Signed)
Pt wife dropped off forms to be completed, request forms be faxed once done. Placed in bin upfront with charge sheet.

## 2017-11-27 NOTE — Telephone Encounter (Signed)
Form completed and placed in basket  

## 2018-03-17 ENCOUNTER — Other Ambulatory Visit: Payer: Self-pay | Admitting: Endocrinology

## 2018-04-29 ENCOUNTER — Encounter: Payer: Self-pay | Admitting: Endocrinology

## 2018-04-29 ENCOUNTER — Ambulatory Visit (INDEPENDENT_AMBULATORY_CARE_PROVIDER_SITE_OTHER): Payer: BLUE CROSS/BLUE SHIELD | Admitting: Endocrinology

## 2018-04-29 VITALS — BP 102/62 | HR 67 | Ht 66.0 in | Wt 155.4 lb

## 2018-04-29 DIAGNOSIS — E119 Type 2 diabetes mellitus without complications: Secondary | ICD-10-CM

## 2018-04-29 DIAGNOSIS — E0500 Thyrotoxicosis with diffuse goiter without thyrotoxic crisis or storm: Secondary | ICD-10-CM

## 2018-04-29 LAB — POCT GLYCOSYLATED HEMOGLOBIN (HGB A1C): HEMOGLOBIN A1C: 5.2 % (ref 4.0–5.6)

## 2018-04-29 NOTE — Progress Notes (Signed)
Subjective:    Patient ID: Christopher Mckenzie, male    DOB: May 17, 1971, 47 y.o.   MRN: [id number]  HPI Pt returns for f/u of diabetes mellitus: DM type: (1 is dx'ed due to [place] and lean body habitus); it is in remission.   Dx'ed: 3419 Complications: none Therapy: no medication DKA: never Severe hypoglycemia: never Pancreatitis: never Other: he has never been on medication for this, as glucose spontaneously normalized.   Interval history: pt states he feels well in general.   He has not recently checked cbg.   Pt returns for f/u of hyperthyroidism (dx'ed 6222; uncertain etiology; he has never had imaging; he chose thionamide rx; he declines RAI, due to cost).  He takes tapazole as rx'ed. Past Medical History:  Diagnosis Date  . ANOSMIA 05/13/2007  . DIABETES MELLITUS 08/29/2010  . HYPERTHYROIDISM 08/29/2010  . WEIGHT LOSS 08/26/2010    Past Surgical History:  Procedure Laterality Date  . VASECTOMY  07/2007    Social History   Socioeconomic History  . Marital status: Divorced    Spouse name: Not on file  . Number of children: 1  . Years of education: Not on file  . Highest education level: Not on file  Occupational History    Employer: [employer]  Social Needs  . Financial resource strain: Not on file  . Food insecurity:    Worry: Not on file    Inability: Not on file  . Transportation needs:    Medical: Not on file    Non-medical: Not on file  Tobacco Use  . Smoking status: Never Smoker  . Smokeless tobacco: Never Used  Substance and Sexual Activity  . Alcohol use: No    Frequency: Never  . Drug use: No  . Sexual activity: Yes  Lifestyle  . Physical activity:    Days per week: Not on file    Minutes per session: Not on file  . Stress: Not on file  Relationships  . Social connections:    Talks on phone: Not on file    Gets together: Not on file    Attends religious service: Not on file    Active member of club or organization: Not on file    Attends meetings  of clubs or organizations: Not on file    Relationship status: Not on file  . Intimate partner violence:    Fear of current or ex partner: Not on file    Emotionally abused: Not on file    Physically abused: Not on file    Forced sexual activity: Not on file  Other Topics Concern  . Not on file  Social History Narrative   Originally from [place], [place] x many years   Regular Exercise-Yes   Married 1 child-2007    No current outpatient medications on file prior to visit.   No current facility-administered medications on file prior to visit.     No Known Allergies  Family History  Problem Relation Age of Onset  . Kidney disease Mother        Kidney Cancer  . Cancer Mother        thyroid and kidney cancer  . Diabetes Father   . Heart disease Neg Hx     BP 102/62 (BP Location: Left Arm, Patient Position: Sitting, Cuff Size: Normal)   Pulse 67   Ht 5\' 6"  (1.676 m)   Wt 155 lb 6.4 oz (70.5 kg)   SpO2 96%   BMI 25.08 kg/m  Review of Systems Denies fever.      Objective:   Physical Exam VITAL SIGNS:  See vs page GENERAL: no distress NECK: There is no palpable thyroid enlargement.  No thyroid nodule is palpable.  No palpable lymphadenopathy at the anterior neck. Pulses: dorsalis pedis intact bilat.   MSK: no deformity of the feet CV: no leg edema Skin:  no ulcer on the feet.  normal color and temp on the feet. Neuro: sensation is intact to touch on the feet    Lab Results  Component Value Date   HGBA1C 5.2 04/29/2018       Assessment & Plan:  Hyperthyroidism, overcontrolled.  I have sent a prescription to your pharmacy, to reduce tapazole to 10 mg qd. DM: in remission.   Patient Instructions  blood tests are requested for you today.  We'll let you know about the results. No medication is needed for diabetes now.  Please come back for a follow-up appointment in 6 months. If ever you have fever while taking methimazole, stop it and call us, even if the  reason is obvious, because of the risk of a rare side-effect. check your blood sugar once a week.  vary the time of day when you check, between before the 3 meals, and at bedtime.  also check if you have symptoms of your blood sugar being too high or too low.  please call us if your blood sugar goes over 200.   With time, your blood sugar will go up again.

## 2018-04-29 NOTE — Patient Instructions (Addendum)
blood tests are requested for you today.  We'll let you know about the results. No medication is needed for diabetes now.  Please come back for a follow-up appointment in 6 months. If ever you have fever while taking methimazole, stop it and call us, even if the reason is obvious, because of the risk of a rare side-effect. check your blood sugar once a week.  vary the time of day when you check, between before the 3 meals, and at bedtime.  also check if you have symptoms of your blood sugar being too high or too low.  please call us if your blood sugar goes over 200.   With time, your blood sugar will go up again.

## 2018-04-30 LAB — TSH: TSH: 7.22 u[IU]/mL — ABNORMAL HIGH (ref 0.35–4.50)

## 2018-04-30 LAB — T4, FREE: FREE T4: 0.79 ng/dL (ref 0.60–1.60)

## 2018-04-30 MED ORDER — METHIMAZOLE 10 MG PO TABS: 10.0000 mg | ORAL_TABLET | Freq: Every day | ORAL | 1 refills | Status: DC

## 2018-05-21 DIAGNOSIS — M9904 Segmental and somatic dysfunction of sacral region: Secondary | ICD-10-CM | POA: Diagnosis not present

## 2018-05-21 DIAGNOSIS — M5386 Other specified dorsopathies, lumbar region: Secondary | ICD-10-CM | POA: Diagnosis not present

## 2018-05-21 DIAGNOSIS — M9902 Segmental and somatic dysfunction of thoracic region: Secondary | ICD-10-CM | POA: Diagnosis not present

## 2018-05-21 DIAGNOSIS — M9903 Segmental and somatic dysfunction of lumbar region: Secondary | ICD-10-CM | POA: Diagnosis not present

## 2018-05-22 DIAGNOSIS — M9902 Segmental and somatic dysfunction of thoracic region: Secondary | ICD-10-CM | POA: Diagnosis not present

## 2018-05-22 DIAGNOSIS — M9903 Segmental and somatic dysfunction of lumbar region: Secondary | ICD-10-CM | POA: Diagnosis not present

## 2018-05-22 DIAGNOSIS — M9904 Segmental and somatic dysfunction of sacral region: Secondary | ICD-10-CM | POA: Diagnosis not present

## 2018-05-22 DIAGNOSIS — M5386 Other specified dorsopathies, lumbar region: Secondary | ICD-10-CM | POA: Diagnosis not present

## 2018-05-27 DIAGNOSIS — M5386 Other specified dorsopathies, lumbar region: Secondary | ICD-10-CM | POA: Diagnosis not present

## 2018-05-27 DIAGNOSIS — M9902 Segmental and somatic dysfunction of thoracic region: Secondary | ICD-10-CM | POA: Diagnosis not present

## 2018-05-27 DIAGNOSIS — M9904 Segmental and somatic dysfunction of sacral region: Secondary | ICD-10-CM | POA: Diagnosis not present

## 2018-05-27 DIAGNOSIS — M9903 Segmental and somatic dysfunction of lumbar region: Secondary | ICD-10-CM | POA: Diagnosis not present

## 2018-05-29 DIAGNOSIS — M9903 Segmental and somatic dysfunction of lumbar region: Secondary | ICD-10-CM | POA: Diagnosis not present

## 2018-05-29 DIAGNOSIS — M9904 Segmental and somatic dysfunction of sacral region: Secondary | ICD-10-CM | POA: Diagnosis not present

## 2018-05-29 DIAGNOSIS — M5386 Other specified dorsopathies, lumbar region: Secondary | ICD-10-CM | POA: Diagnosis not present

## 2018-05-29 DIAGNOSIS — M9902 Segmental and somatic dysfunction of thoracic region: Secondary | ICD-10-CM | POA: Diagnosis not present

## 2018-06-04 DIAGNOSIS — M5386 Other specified dorsopathies, lumbar region: Secondary | ICD-10-CM | POA: Diagnosis not present

## 2018-06-04 DIAGNOSIS — M9902 Segmental and somatic dysfunction of thoracic region: Secondary | ICD-10-CM | POA: Diagnosis not present

## 2018-06-04 DIAGNOSIS — M9903 Segmental and somatic dysfunction of lumbar region: Secondary | ICD-10-CM | POA: Diagnosis not present

## 2018-06-04 DIAGNOSIS — M9904 Segmental and somatic dysfunction of sacral region: Secondary | ICD-10-CM | POA: Diagnosis not present

## 2018-06-07 DIAGNOSIS — M5386 Other specified dorsopathies, lumbar region: Secondary | ICD-10-CM | POA: Diagnosis not present

## 2018-06-07 DIAGNOSIS — M9902 Segmental and somatic dysfunction of thoracic region: Secondary | ICD-10-CM | POA: Diagnosis not present

## 2018-06-07 DIAGNOSIS — M9903 Segmental and somatic dysfunction of lumbar region: Secondary | ICD-10-CM | POA: Diagnosis not present

## 2018-06-07 DIAGNOSIS — M9904 Segmental and somatic dysfunction of sacral region: Secondary | ICD-10-CM | POA: Diagnosis not present

## 2018-06-10 DIAGNOSIS — M9903 Segmental and somatic dysfunction of lumbar region: Secondary | ICD-10-CM | POA: Diagnosis not present

## 2018-06-10 DIAGNOSIS — M5386 Other specified dorsopathies, lumbar region: Secondary | ICD-10-CM | POA: Diagnosis not present

## 2018-06-10 DIAGNOSIS — M9904 Segmental and somatic dysfunction of sacral region: Secondary | ICD-10-CM | POA: Diagnosis not present

## 2018-06-10 DIAGNOSIS — M9902 Segmental and somatic dysfunction of thoracic region: Secondary | ICD-10-CM | POA: Diagnosis not present

## 2018-06-12 DIAGNOSIS — M9902 Segmental and somatic dysfunction of thoracic region: Secondary | ICD-10-CM | POA: Diagnosis not present

## 2018-06-12 DIAGNOSIS — M9903 Segmental and somatic dysfunction of lumbar region: Secondary | ICD-10-CM | POA: Diagnosis not present

## 2018-06-12 DIAGNOSIS — M9904 Segmental and somatic dysfunction of sacral region: Secondary | ICD-10-CM | POA: Diagnosis not present

## 2018-06-12 DIAGNOSIS — M5386 Other specified dorsopathies, lumbar region: Secondary | ICD-10-CM | POA: Diagnosis not present

## 2018-08-03 IMAGING — CR DG HAND COMPLETE 3+V*R*
3 series · 3 of 3 positions shown · non-contrast
Comparison: None.

CLINICAL DATA: Right ring finger injury at work.

EXAM:
RIGHT HAND - COMPLETE 3+ VIEW

[x hand pa right]
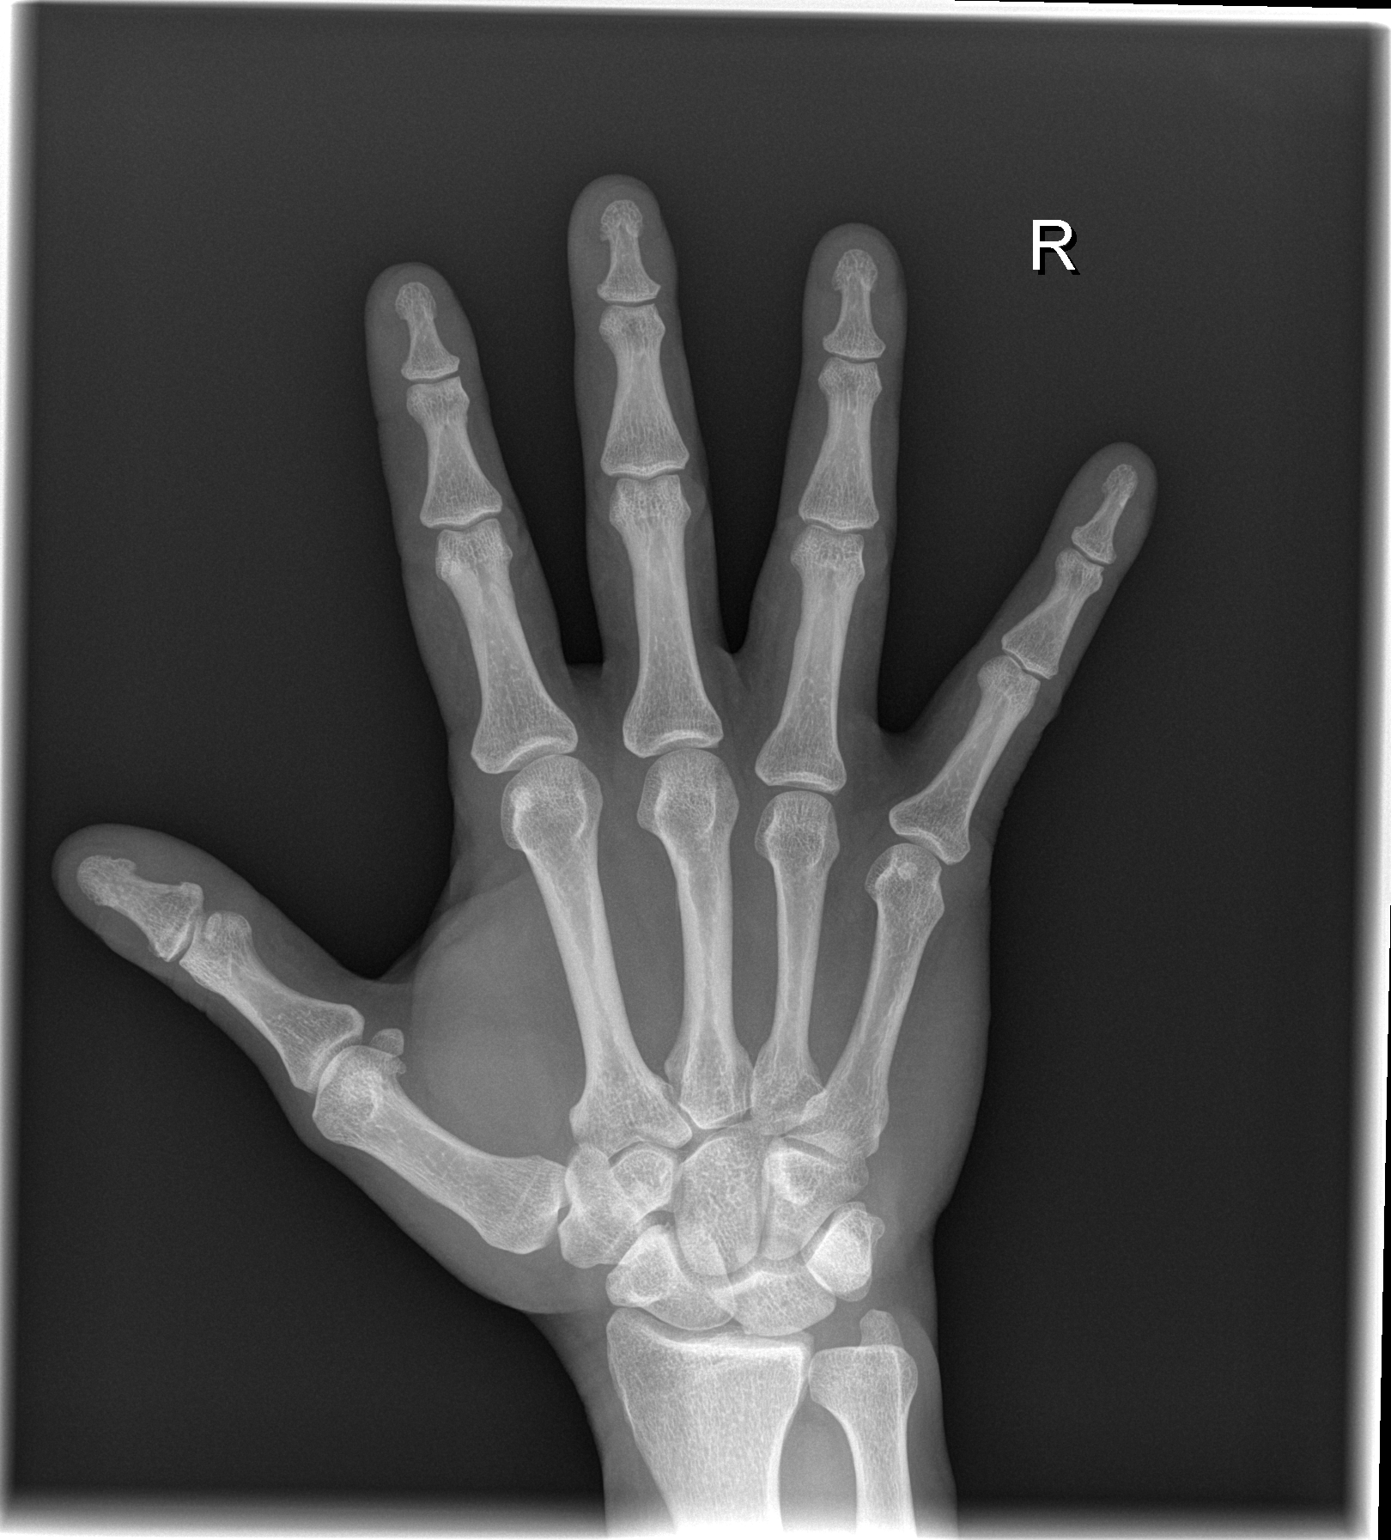

[x hand oblique right]
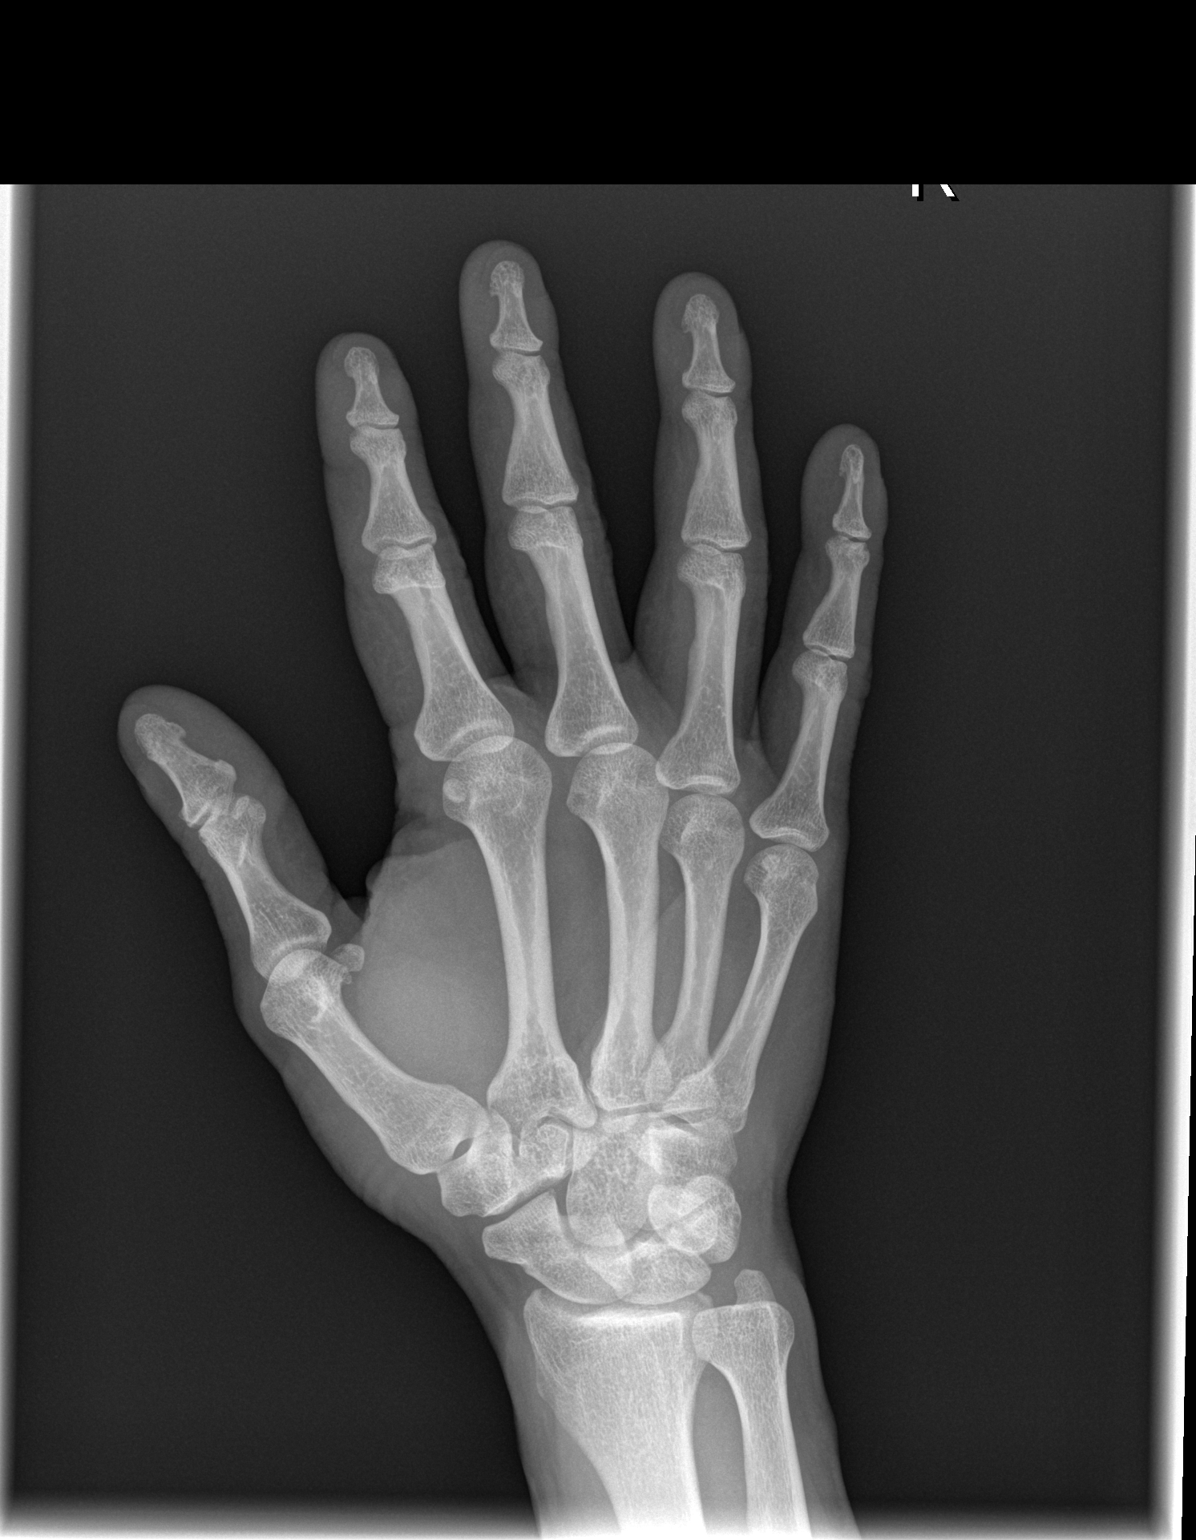

[x hand lat right]
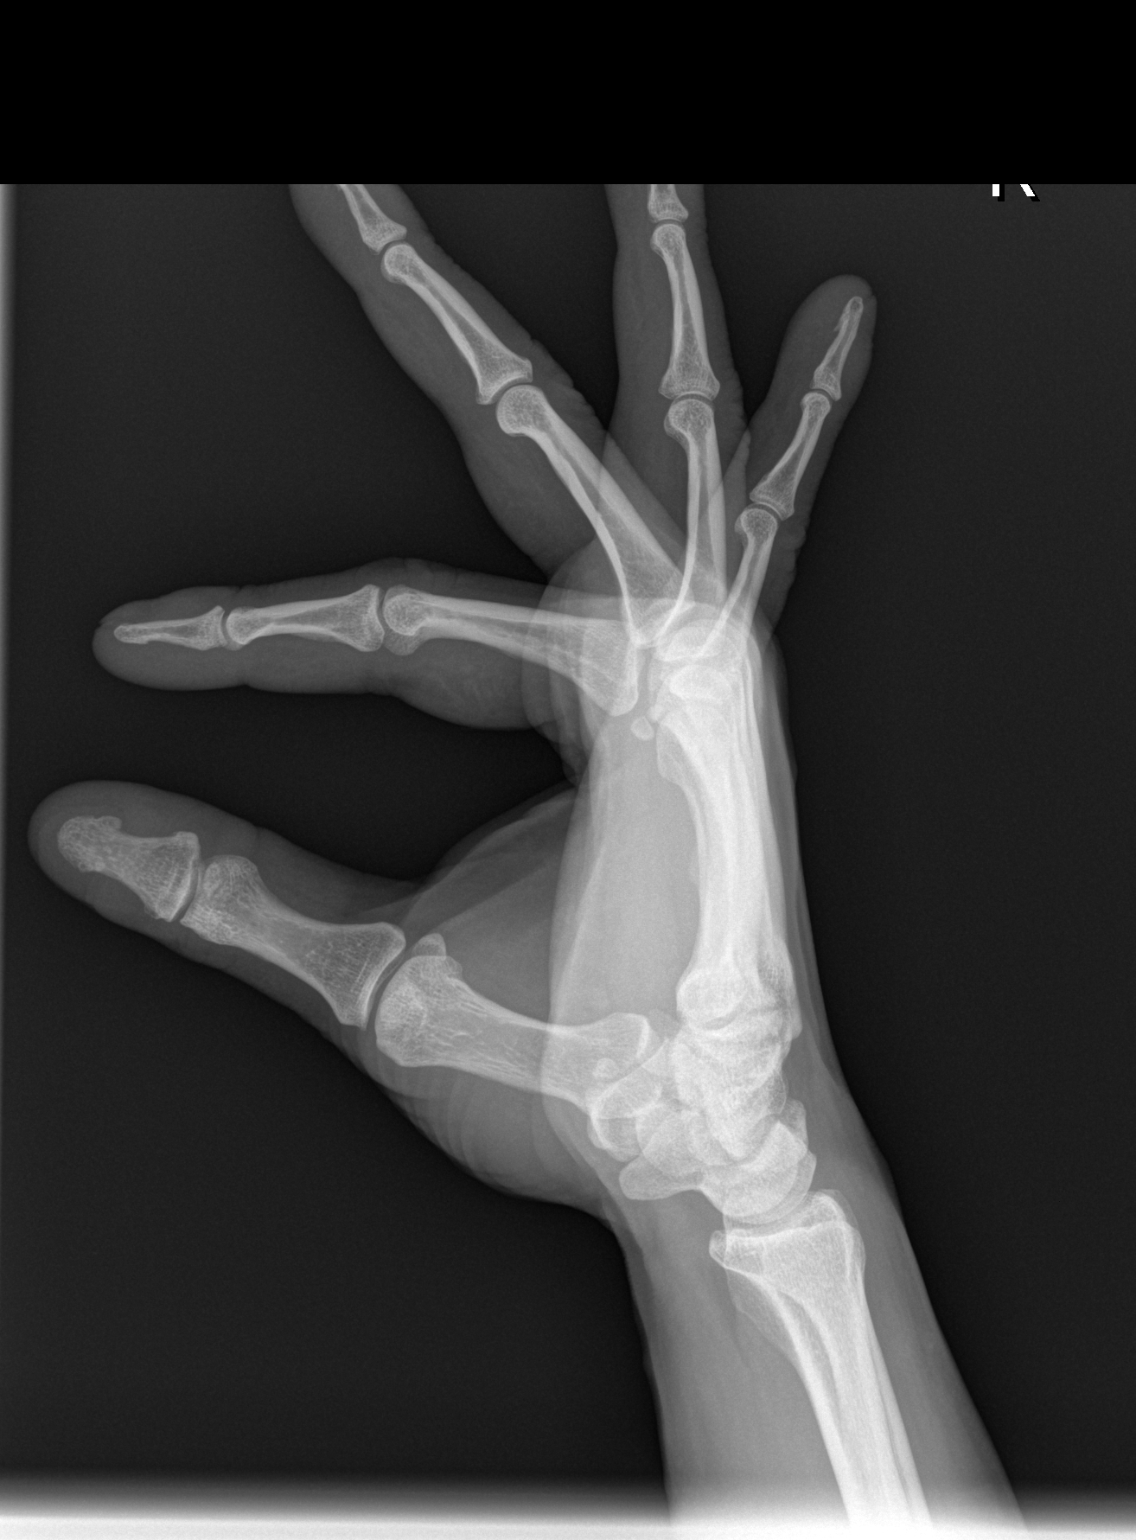

[3 of 3 positions shown; findings below may reference images not displayed]

FINDINGS: There is no evidence of fracture or dislocation. There is no
evidence of arthropathy or other focal bone abnormality. Soft
tissues are unremarkable.
IMPRESSION: Negative.

## 2018-08-06 ENCOUNTER — Encounter: Payer: Self-pay | Admitting: Endocrinology

## 2018-08-06 ENCOUNTER — Ambulatory Visit (INDEPENDENT_AMBULATORY_CARE_PROVIDER_SITE_OTHER): Payer: BLUE CROSS/BLUE SHIELD | Admitting: Endocrinology

## 2018-08-06 VITALS — BP 100/62 | HR 76 | Ht 66.0 in | Wt 154.4 lb

## 2018-08-06 DIAGNOSIS — E0500 Thyrotoxicosis with diffuse goiter without thyrotoxic crisis or storm: Secondary | ICD-10-CM

## 2018-08-06 NOTE — Patient Instructions (Signed)
blood tests are requested for you today.  We'll let you know about the results. Please come back for a follow-up appointment in 4 months. If ever you have fever while taking methimazole, stop it and call us, even if the reason is obvious, because of the risk of a rare side-effect. check your blood sugar once a week.  vary the time of day when you check, between before the 3 meals, and at bedtime.  also check if you have symptoms of your blood sugar being too high or too low.  please call us if your blood sugar goes over 200.   With time, your blood sugar will go up again.

## 2018-08-06 NOTE — Progress Notes (Signed)
Subjective:    Patient ID: Christopher Mckenzie, male    DOB: 1970-10-27, 48 y.o.   MRN: [id number]  HPI Pt returns for f/u of diabetes mellitus: DM type: (1 is dx'ed due to [place] and lean body habitus); it is in remission.   Dx'ed: 7829 Complications: none Therapy: no medication DKA: never Severe hypoglycemia: never Pancreatitis: never Other: he has never been on medication for this, as glucose spontaneously normalized.   Interval history: he takes no diabetes medication Pt returns for f/u of hyperthyroidism (dx'ed 5621; uncertain etiology; he has never had imaging; he chose thionamide rx; he declines RAI, due to cost).  He takes tapazole as rx'ed.  pt states he feels well in general. Past Medical History:  Diagnosis Date  . ANOSMIA 05/13/2007  . DIABETES MELLITUS 08/29/2010  . HYPERTHYROIDISM 08/29/2010  . WEIGHT LOSS 08/26/2010    Past Surgical History:  Procedure Laterality Date  . VASECTOMY  07/2007    Social History   Socioeconomic History  . Marital status: Divorced    Spouse name: Not on file  . Number of children: 1  . Years of education: Not on file  . Highest education level: Not on file  Occupational History    Employer: [employer]  Social Needs  . Financial resource strain: Not on file  . Food insecurity:    Worry: Not on file    Inability: Not on file  . Transportation needs:    Medical: Not on file    Non-medical: Not on file  Tobacco Use  . Smoking status: Never Smoker  . Smokeless tobacco: Never Used  Substance and Sexual Activity  . Alcohol use: No    Frequency: Never  . Drug use: No  . Sexual activity: Yes  Lifestyle  . Physical activity:    Days per week: Not on file    Minutes per session: Not on file  . Stress: Not on file  Relationships  . Social connections:    Talks on phone: Not on file    Gets together: Not on file    Attends religious service: Not on file    Active member of club or organization: Not on file    Attends meetings of  clubs or organizations: Not on file    Relationship status: Not on file  . Intimate partner violence:    Fear of current or ex partner: Not on file    Emotionally abused: Not on file    Physically abused: Not on file    Forced sexual activity: Not on file  Other Topics Concern  . Not on file  Social History Narrative   Originally from [place], [place] x many years   Regular Exercise-Yes   Married 1 child-2007    Current Outpatient Medications on File Prior to Visit  Medication Sig Dispense Refill  . methimazole (TAPAZOLE) 10 MG tablet Take 1 tablet (10 mg total) by mouth daily. 90 tablet 1   No current facility-administered medications on file prior to visit.     No Known Allergies  Family History  Problem Relation Age of Onset  . Kidney disease Mother        Kidney Cancer  . Cancer Mother        thyroid and kidney cancer  . Diabetes Father   . Heart disease Neg Hx     BP 100/62 (BP Location: Right Arm, Patient Position: Sitting, Cuff Size: Normal)   Pulse 76   Ht 5\' 6"  (1.676 m)  Wt 154 lb 6.4 oz (70 kg)   SpO2 95%   BMI 24.92 kg/m    Review of Systems Denies fever    Objective:   Physical Exam VITAL SIGNS:  See vs page GENERAL: no distress NECK: thyroid is slightly and diffusely enlarged.  No palpable nodule.    Lab Results  Component Value Date   TSH 1.49 08/06/2018      Assessment & Plan:  Hyperthyroidism: well-controlled Type 1 DM, in remission.  Patient Instructions  blood tests are requested for you today.  We'll let you know about the results. Please come back for a follow-up appointment in 4 months. If ever you have fever while taking methimazole, stop it and call us, even if the reason is obvious, because of the risk of a rare side-effect. check your blood sugar once a week.  vary the time of day when you check, between before the 3 meals, and at bedtime.  also check if you have symptoms of your blood sugar being too high or too low.  please  call us if your blood sugar goes over 200.   With time, your blood sugar will go up again.

## 2018-08-07 LAB — T4, FREE: Free T4: 1.02 ng/dL (ref 0.60–1.60)

## 2018-08-07 LAB — TSH: TSH: 1.49 u[IU]/mL (ref 0.35–4.50)

## 2018-10-28 ENCOUNTER — Other Ambulatory Visit: Payer: Self-pay | Admitting: Endocrinology

## 2018-11-28 DIAGNOSIS — M25512 Pain in left shoulder: Secondary | ICD-10-CM | POA: Diagnosis not present

## 2018-11-28 DIAGNOSIS — G8929 Other chronic pain: Secondary | ICD-10-CM | POA: Diagnosis not present

## 2018-12-03 ENCOUNTER — Encounter: Payer: Self-pay | Admitting: Endocrinology

## 2018-12-04 ENCOUNTER — Ambulatory Visit (INDEPENDENT_AMBULATORY_CARE_PROVIDER_SITE_OTHER): Payer: BC Managed Care – PPO | Admitting: Endocrinology

## 2018-12-04 ENCOUNTER — Other Ambulatory Visit: Payer: Self-pay

## 2018-12-04 DIAGNOSIS — E089 Diabetes mellitus due to underlying condition without complications: Secondary | ICD-10-CM | POA: Diagnosis not present

## 2018-12-04 DIAGNOSIS — E0500 Thyrotoxicosis with diffuse goiter without thyrotoxic crisis or storm: Secondary | ICD-10-CM

## 2018-12-04 DIAGNOSIS — E119 Type 2 diabetes mellitus without complications: Secondary | ICD-10-CM

## 2018-12-04 NOTE — Patient Instructions (Signed)
Blood tests are requested for you today.  We'll let you know about the results.  Please come back for a follow-up appointment in 6 months.   

## 2018-12-04 NOTE — Progress Notes (Addendum)
Subjective:    Patient ID: Christopher Mckenzie, male    DOB: Dec 20, 1970, 48 y.o.   MRN: [id number]  HPI  telehealth visit today via phone x 6 minutes Alternatives to telehealth are presented to this patient, and the patient agrees to the telehealth visit. Pt is advised of the cost of the visit, and agrees to this, also.   Patient is at home, and I am at the office.   Persons attending the telehealth visit: the patient and I.   Pt returns for f/u of diabetes mellitus:  DM type: (1 is dx'ed due to [place] and lean body habitus); it is in remission.   Dx'ed: 8295 Complications: none Therapy: no medication DKA: never Severe hypoglycemia: never Pancreatitis: never Other: he has never been on medication for this, as glucose spontaneously normalized.   Interval history: he takes no diabetes medication.   Pt returns for f/u of hyperthyroidism (dx'ed 6213; uncertain etiology; he has never had imaging; he chose thionamide rx; he declines RAI, due to cost).  He takes tapazole as rx'ed.  pt states he feels well in general.   Past Medical History:  Diagnosis Date  . ANOSMIA 05/13/2007  . DIABETES MELLITUS 08/29/2010  . HYPERTHYROIDISM 08/29/2010  . WEIGHT LOSS 08/26/2010    Past Surgical History:  Procedure Laterality Date  . VASECTOMY  07/2007    Social History   Socioeconomic History  . Marital status: Divorced    Spouse name: Not on file  . Number of children: 1  . Years of education: Not on file  . Highest education level: Not on file  Occupational History    Employer: [employer]  Social Needs  . Financial resource strain: Not on file  . Food insecurity:    Worry: Not on file    Inability: Not on file  . Transportation needs:    Medical: Not on file    Non-medical: Not on file  Tobacco Use  . Smoking status: Never Smoker  . Smokeless tobacco: Never Used  Substance and Sexual Activity  . Alcohol use: No    Frequency: Never  . Drug use: No  . Sexual activity: Yes  Lifestyle  .  Physical activity:    Days per week: Not on file    Minutes per session: Not on file  . Stress: Not on file  Relationships  . Social connections:    Talks on phone: Not on file    Gets together: Not on file    Attends religious service: Not on file    Active member of club or organization: Not on file    Attends meetings of clubs or organizations: Not on file    Relationship status: Not on file  . Intimate partner violence:    Fear of current or ex partner: Not on file    Emotionally abused: Not on file    Physically abused: Not on file    Forced sexual activity: Not on file  Other Topics Concern  . Not on file  Social History Narrative   Originally from [place], In [place] x many years   Regular Exercise-Yes   Married 1 child-2007    Current Outpatient Medications on File Prior to Visit  Medication Sig Dispense Refill  . methimazole (TAPAZOLE) 10 MG tablet TAKE 1 TABLET BY MOUTH EVERY DAY 90 tablet 1   No current facility-administered medications on file prior to visit.     No Known Allergies  Family History  Problem Relation Age of Onset  .  Kidney disease Mother        Kidney Cancer  . Cancer Mother        thyroid and kidney cancer  . Diabetes Father   . Heart disease Neg Hx     There were no vitals taken for this visit.   Review of Systems Denies fever.      Objective:   Physical Exam       Assessment & Plan:  Hyperthyroidism: he declines RAI, due to cost.  DM, apparently in remission.

## 2018-12-23 ENCOUNTER — Other Ambulatory Visit: Payer: Self-pay

## 2018-12-23 ENCOUNTER — Other Ambulatory Visit (INDEPENDENT_AMBULATORY_CARE_PROVIDER_SITE_OTHER): Payer: BC Managed Care – PPO

## 2018-12-23 DIAGNOSIS — E0500 Thyrotoxicosis with diffuse goiter without thyrotoxic crisis or storm: Secondary | ICD-10-CM | POA: Diagnosis not present

## 2018-12-23 DIAGNOSIS — E119 Type 2 diabetes mellitus without complications: Secondary | ICD-10-CM | POA: Diagnosis not present

## 2018-12-23 LAB — TSH: TSH: 0.99 u[IU]/mL (ref 0.35–4.50)

## 2018-12-23 LAB — HEMOGLOBIN A1C: Hgb A1c MFr Bld: 5.5 % (ref 4.6–6.5)

## 2019-05-02 ENCOUNTER — Other Ambulatory Visit: Payer: Self-pay | Admitting: Endocrinology

## 2019-08-11 DIAGNOSIS — Z20828 Contact with and (suspected) exposure to other viral communicable diseases: Secondary | ICD-10-CM | POA: Diagnosis not present

## 2019-11-20 DIAGNOSIS — L819 Disorder of pigmentation, unspecified: Secondary | ICD-10-CM | POA: Diagnosis not present

## 2019-11-20 DIAGNOSIS — L821 Other seborrheic keratosis: Secondary | ICD-10-CM | POA: Diagnosis not present

## 2019-11-20 DIAGNOSIS — L570 Actinic keratosis: Secondary | ICD-10-CM | POA: Diagnosis not present

## 2019-11-20 DIAGNOSIS — L814 Other melanin hyperpigmentation: Secondary | ICD-10-CM | POA: Diagnosis not present

## 2019-11-27 ENCOUNTER — Telehealth: Payer: Self-pay | Admitting: Family Medicine

## 2019-11-27 NOTE — Telephone Encounter (Signed)
Ok to re-establish 

## 2019-11-27 NOTE — Telephone Encounter (Signed)
Pt's wife called in stating that he would like to re-est with [facility]. It has been since 06/2017 since last visit.

## 2019-11-27 NOTE — Telephone Encounter (Signed)
Pt has been scheduled.  °

## 2019-12-05 ENCOUNTER — Other Ambulatory Visit: Payer: Self-pay

## 2019-12-05 ENCOUNTER — Encounter: Payer: Self-pay | Admitting: Family Medicine

## 2019-12-05 ENCOUNTER — Ambulatory Visit (INDEPENDENT_AMBULATORY_CARE_PROVIDER_SITE_OTHER): Payer: BC Managed Care – PPO | Admitting: Family Medicine

## 2019-12-05 VITALS — BP 110/68 | HR 73 | Temp 97.9°F | Resp 16 | Ht 66.0 in | Wt 154.0 lb

## 2019-12-05 DIAGNOSIS — Z23 Encounter for immunization: Secondary | ICD-10-CM | POA: Diagnosis not present

## 2019-12-05 DIAGNOSIS — Z8639 Personal history of other endocrine, nutritional and metabolic disease: Secondary | ICD-10-CM | POA: Diagnosis not present

## 2019-12-05 DIAGNOSIS — Z125 Encounter for screening for malignant neoplasm of prostate: Secondary | ICD-10-CM | POA: Diagnosis not present

## 2019-12-05 DIAGNOSIS — Z0000 Encounter for general adult medical examination without abnormal findings: Secondary | ICD-10-CM

## 2019-12-05 DIAGNOSIS — Z1211 Encounter for screening for malignant neoplasm of colon: Secondary | ICD-10-CM

## 2019-12-05 NOTE — Addendum Note (Signed)
Addended by: Fritz Pickerel on: 12/05/2019 04:27 PM   Modules accepted: Orders

## 2019-12-05 NOTE — Patient Instructions (Signed)
Follow up in 1 year or as needed We'll notify you of your lab results and make any changes if needed Keep up the good work on healthy diet and regular exercise- you look great!!! We'll call you with your GI appt Call with any questions or concerns Have a great summer!!!

## 2019-12-05 NOTE — Assessment & Plan Note (Signed)
Pt's PE WNL.  Tdap given.  UTD on COVID vaccine.  Refer for colon cancer screen.  Check labs.  Anticipatory guidance provided.

## 2019-12-05 NOTE — Assessment & Plan Note (Signed)
Pt has had normal A1C for at least last 4 yr.  Check labs.  Will continue to follow.

## 2019-12-05 NOTE — Progress Notes (Signed)
° °  Subjective:    Patient ID: Christopher Mckenzie, male    DOB: July 28, 1970, 49 y.o.   MRN: [id number]  HPI CPE- due for Tdap.  UTD on COVID vaccines   Review of Systems Patient reports no vision changes, anorexia, fever ,adenopathy, persistant/recurrent hoarseness, swallowing issues, chest pain, palpitations, edema, persistant/recurrent cough, hemoptysis, dyspnea (rest,exertional, paroxysmal nocturnal), gastrointestinal  bleeding (melena, rectal bleeding), abdominal pain, excessive heart burn, syncope, focal weakness, memory loss, numbness & tingling, skin/hair/nail changes, depression, anxiety, abnormal bruising/bleeding, musculoskeletal symptoms/signs.   + hearing loss + nocturia    Objective:   Physical Exam General Appearance:    Alert, cooperative, no distress, appears stated age  Head:    Normocephalic, without obvious abnormality, atraumatic  Eyes:    PERRL, conjunctiva/corneas clear, EOM's intact, fundi    benign, both eyes       Ears:    Normal TM's and external ear canals, both ears  Nose:   Deferred due to COVID  Throat:   Neck:   Supple, symmetrical, trachea midline, no adenopathy;       thyroid:  No enlargement/tenderness/nodules  Back:     Symmetric, no curvature, ROM normal, no CVA tenderness  Lungs:     Clear to auscultation bilaterally, respirations unlabored  Chest wall:    No tenderness or deformity  Heart:    Regular rate and rhythm, S1 and S2 normal, no murmur, rub   or gallop  Abdomen:     Soft, non-tender, bowel sounds active all four quadrants,    no masses, no organomegaly  Genitalia:    deferred  Rectal:    Extremities:   Extremities normal, atraumatic, no cyanosis or edema  Pulses:   2+ and symmetric all extremities  Skin:   Skin color, texture, turgor normal, no rashes or lesions  Lymph nodes:   Cervical, supraclavicular, and axillary nodes normal  Neurologic:   CNII-XII intact. Normal strength, sensation and reflexes      throughout            Assessment & Plan:

## 2019-12-06 LAB — CBC WITH DIFFERENTIAL/PLATELET
Absolute Monocytes: 375 cells/uL (ref 200–950)
Basophils Absolute: 30 cells/uL (ref 0–200)
Basophils Relative: 0.6 %
Eosinophils Absolute: 190 cells/uL (ref 15–500)
Eosinophils Relative: 3.8 %
HCT: 39.8 % (ref 38.5–50.0)
Hemoglobin: 13.2 g/dL (ref 13.2–17.1)
Lymphs Abs: 1775 cells/uL (ref 850–3900)
MCH: 28.3 pg (ref 27.0–33.0)
MCHC: 33.2 g/dL (ref 32.0–36.0)
MCV: 85.4 fL (ref 80.0–100.0)
MPV: 10.2 fL (ref 7.5–12.5)
Monocytes Relative: 7.5 %
Neutro Abs: 2630 cells/uL (ref 1500–7800)
Neutrophils Relative %: 52.6 %
Platelets: 217 10*3/uL (ref 140–400)
RBC: 4.66 10*6/uL (ref 4.20–5.80)
RDW: 13.3 % (ref 11.0–15.0)
Total Lymphocyte: 35.5 %
WBC: 5 10*3/uL (ref 3.8–10.8)

## 2019-12-06 LAB — BASIC METABOLIC PANEL
BUN: 19 mg/dL (ref 7–25)
CO2: 24 mmol/L (ref 20–32)
Calcium: 9.6 mg/dL (ref 8.6–10.3)
Chloride: 104 mmol/L (ref 98–110)
Creat: 1.08 mg/dL (ref 0.60–1.35)
Glucose, Bld: 87 mg/dL (ref 65–99)
Potassium: 4.5 mmol/L (ref 3.5–5.3)
Sodium: 138 mmol/L (ref 135–146)

## 2019-12-06 LAB — LIPID PANEL
Cholesterol: 195 mg/dL (ref <200)
HDL: 49 mg/dL (ref >=40)
LDL Cholesterol (Calc): 118 mg/dL (calc) — ABNORMAL HIGH
Non-HDL Cholesterol (Calc): 146 mg/dL (calc) — ABNORMAL HIGH (ref <130)
Total CHOL/HDL Ratio: 4 (calc) (ref <5.0)
Triglycerides: 167 mg/dL — ABNORMAL HIGH (ref <150)

## 2019-12-06 LAB — HEPATIC FUNCTION PANEL
AG Ratio: 2.1 (calc) (ref 1.0–2.5)
ALT: 26 U/L (ref 9–46)
AST: 25 U/L (ref 10–40)
Albumin: 4.4 g/dL (ref 3.6–5.1)
Alkaline phosphatase (APISO): 55 U/L (ref 36–130)
Bilirubin, Direct: 0.1 mg/dL (ref 0.0–0.2)
Globulin: 2.1 g/dL (calc) (ref 1.9–3.7)
Indirect Bilirubin: 0.6 mg/dL (calc) (ref 0.2–1.2)
Total Bilirubin: 0.7 mg/dL (ref 0.2–1.2)
Total Protein: 6.5 g/dL (ref 6.1–8.1)

## 2019-12-06 LAB — HEMOGLOBIN A1C
Hgb A1c MFr Bld: 5.1 % of total Hgb (ref <5.7)
Mean Plasma Glucose: 100 (calc)
eAG (mmol/L): 5.5 (calc)

## 2019-12-06 LAB — TSH: TSH: 7.32 mIU/L — ABNORMAL HIGH (ref 0.40–4.50)

## 2019-12-06 LAB — PSA: PSA: 0.8 ng/mL (ref <=4.0)

## 2019-12-08 NOTE — Progress Notes (Signed)
Called pt and lmovm to return call.

## 2019-12-31 DIAGNOSIS — Z20822 Contact with and (suspected) exposure to covid-19: Secondary | ICD-10-CM | POA: Diagnosis not present

## 2020-01-01 ENCOUNTER — Ambulatory Visit (INDEPENDENT_AMBULATORY_CARE_PROVIDER_SITE_OTHER): Payer: BC Managed Care – PPO | Admitting: Endocrinology

## 2020-01-01 ENCOUNTER — Encounter: Payer: Self-pay | Admitting: Endocrinology

## 2020-01-01 ENCOUNTER — Other Ambulatory Visit: Payer: Self-pay

## 2020-01-01 VITALS — BP 102/70 | HR 95 | Ht 66.0 in | Wt 153.4 lb

## 2020-01-01 DIAGNOSIS — E119 Type 2 diabetes mellitus without complications: Secondary | ICD-10-CM

## 2020-01-01 DIAGNOSIS — E0500 Thyrotoxicosis with diffuse goiter without thyrotoxic crisis or storm: Secondary | ICD-10-CM | POA: Diagnosis not present

## 2020-01-01 MED ORDER — METHIMAZOLE 5 MG PO TABS: 5.0000 mg | ORAL_TABLET | Freq: Every day | ORAL | 3 refills | Status: DC

## 2020-01-01 NOTE — Progress Notes (Signed)
Subjective:    Patient ID: Christopher Mckenzie, male    DOB: 1971/06/26, 49 y.o.   MRN: [id number]  HPI Pt returns for f/u of diabetes mellitus:  DM type: (1 was dx'ed due to [place] and lean body habitus), in remission.   Dx'ed: 6063 Complications: none Therapy: no medication DKA: never Severe hypoglycemia: never Pancreatitis: never Other: he has never been on medication for this, as glucose spontaneously normalized.   Interval history: he takes no diabetes medication.   Pt returns for f/u of hyperthyroidism (dx'ed 0160; uncertain etiology; he has never had imaging; he chose thionamide rx; he declines RAI, due to cost).  He takes tapazole as rx'ed.  pt states he feels well in general.   Past Medical History:  Diagnosis Date  . ANOSMIA 05/13/2007  . DIABETES MELLITUS 08/29/2010  . HYPERTHYROIDISM 08/29/2010  . WEIGHT LOSS 08/26/2010    Past Surgical History:  Procedure Laterality Date  . VASECTOMY  07/2007    Social History   Socioeconomic History  . Marital status: Divorced    Spouse name: Not on file  . Number of children: 1  . Years of education: Not on file  . Highest education level: Not on file  Occupational History    Employer: [employer]  Tobacco Use  . Smoking status: Never Smoker  . Smokeless tobacco: Never Used  Vaping Use  . Vaping Use: Never used  Substance and Sexual Activity  . Alcohol use: No  . Drug use: No  . Sexual activity: Yes  Other Topics Concern  . Not on file  Social History Narrative   Originally from [place], In [place] x many years   Regular Exercise-Yes   Married 1 child-2007   Social Determinants of Health   Financial Resource Strain:   . Difficulty of Paying Living Expenses:   Food Insecurity:   . Worried About Charity fundraiser in the Last Year:   . Arboriculturist in the Last Year:   Transportation Needs:   . Film/video editor (Medical):   Marland Kitchen Lack of Transportation (Non-Medical):   Physical Activity:   . Days of Exercise per  Week:   . Minutes of Exercise per Session:   Stress:   . Feeling of Stress :   Social Connections:   . Frequency of Communication with Friends and Family:   . Frequency of Social Gatherings with Friends and Family:   . Attends Religious Services:   . Active Member of Clubs or Organizations:   . Attends Archivist Meetings:   Marland Kitchen Marital Status:   Intimate Partner Violence:   . Fear of Current or Ex-Partner:   . Emotionally Abused:   Marland Kitchen Physically Abused:   . Sexually Abused:     No current outpatient medications on file prior to visit.   No current facility-administered medications on file prior to visit.    No Known Allergies  Family History  Problem Relation Age of Onset  . Kidney disease Mother        Kidney Cancer  . Cancer Mother        thyroid and kidney cancer  . Diabetes Father   . Heart disease Neg Hx     BP 102/70   Pulse 95   Ht 5\' 6"  (1.676 m)   Wt 153 lb 6.4 oz (69.6 kg)   SpO2 95%   BMI 24.76 kg/m    Review of Systems Denies fever    Objective:   Physical  Exam VITAL SIGNS:  See vs page GENERAL: no distress NECK: thyroid is slightly enlarged, with irreg surface.   Pulses: dorsalis pedis intact bilat.   MSK: no deformity of the feet CV: no leg edema Skin:  no ulcer on the feet.  normal color and temp on the feet.  Neuro: sensation is intact to touch on the feet.    Lab Results  Component Value Date   HGBA1C 5.1 12/05/2019   Lab Results  Component Value Date   TSH 7.32 (H) 12/05/2019        Assessment & Plan:  Hypothyroidism, due to tapazole Type 1 DM, in remission.  we'll follow  Patient Instructions  I have sent a prescription to your pharmacy, to reduce the methimazole to 5 mg daily. If ever you have fever while taking methimazole, stop it and call us, even if the reason is obvious, because of the risk of a rare side-effect. It is best to never miss the medication.  However, if you do miss it, next best is to double up  the next time.  Please come back for a follow-up appointment in 3 months.

## 2020-01-01 NOTE — Patient Instructions (Addendum)
I have sent a prescription to your pharmacy, to reduce the methimazole to 5 mg daily. If ever you have fever while taking methimazole, stop it and call us, even if the reason is obvious, because of the risk of a rare side-effect. It is best to never miss the medication.  However, if you do miss it, next best is to double up the next time.  Please come back for a follow-up appointment in 3 months.

## 2020-01-15 ENCOUNTER — Ambulatory Visit (AMBULATORY_SURGERY_CENTER): Payer: Self-pay | Admitting: *Deleted

## 2020-01-15 VITALS — Ht 66.0 in | Wt 153.0 lb

## 2020-01-15 DIAGNOSIS — Z1211 Encounter for screening for malignant neoplasm of colon: Secondary | ICD-10-CM

## 2020-01-15 NOTE — Progress Notes (Signed)
No egg or soy allergy known to patient  No issues with past sedation with any surgeries or procedures no intubation problems in the past  No diet pills per patient No home 02 use per patient  No blood thinners per patient  Pt denies issues with constipation  No A fib or A flutter  EMMI video to pt or MyChart  COVID 19 guidelines implemented in [facility] today   Due to the COVID-19 pandemic we are asking patients to follow these guidelines. Please only bring one care partner. Please be aware that your care partner may wait in the car in the parking lot or if they feel like they will be too hot to wait in the car, they may wait in the lobby on the 4th floor. All care partners are required to wear a mask the entire time (we do not have any that we can provide them), they need to practice social distancing, and we will do a Covid check for all patient's and care partners when you arrive. Also we will check their temperature and your temperature. If the care partner waits in their car they need to stay in the parking lot the entire time and we will call them on their cell phone when the patient is ready for discharge so they can bring the car to the front of the building. Also all patient's will need to wear a mask into building.  

## 2020-01-16 ENCOUNTER — Encounter: Payer: Self-pay | Admitting: Gastroenterology

## 2020-01-30 ENCOUNTER — Other Ambulatory Visit: Payer: Self-pay

## 2020-01-30 ENCOUNTER — Encounter: Payer: Self-pay | Admitting: Gastroenterology

## 2020-01-30 ENCOUNTER — Ambulatory Visit (AMBULATORY_SURGERY_CENTER): Payer: BC Managed Care – PPO | Admitting: Gastroenterology

## 2020-01-30 VITALS — BP 105/72 | HR 66 | Temp 98.6°F | Resp 12 | Ht 66.0 in | Wt 155.0 lb

## 2020-01-30 DIAGNOSIS — Z1211 Encounter for screening for malignant neoplasm of colon: Secondary | ICD-10-CM

## 2020-01-30 MED ORDER — SODIUM CHLORIDE 0.9 % IV SOLN: 500.0000 mL | Freq: Once | INTRAVENOUS | Status: DC

## 2020-01-30 NOTE — Progress Notes (Signed)
To PACU, VSS. Report to [email or web address] 

## 2020-01-30 NOTE — Progress Notes (Signed)
History reviewed with patient. VS-Christopher Mckenzie

## 2020-01-30 NOTE — Patient Instructions (Signed)
YOU HAD AN ENDOSCOPIC PROCEDURE TODAY AT THE [facility]:   Refer to the procedure report that was given to you for any specific questions about what was found during the examination.  If the procedure report does not answer your questions, please call your gastroenterologist to clarify.  If you requested that your care partner not be given the details of your procedure findings, then the procedure report has been included in a sealed envelope for you to review at your convenience later.  YOU SHOULD EXPECT: Some feelings of bloating in the abdomen. Passage of more gas than usual.  Walking can help get rid of the air that was put into your GI tract during the procedure and reduce the bloating. If you had a lower endoscopy (such as a colonoscopy or flexible sigmoidoscopy) you may notice spotting of blood in your stool or on the toilet paper. If you underwent a bowel prep for your procedure, you may not have a normal bowel movement for a few days.  Please Note:  You might notice some irritation and congestion in your nose or some drainage.  This is from the oxygen used during your procedure.  There is no need for concern and it should clear up in a day or so.  SYMPTOMS TO REPORT IMMEDIATELY:   Following lower endoscopy (colonoscopy or flexible sigmoidoscopy):  Excessive amounts of blood in the stool  Significant tenderness or worsening of abdominal pains  Swelling of the abdomen that is new, acute  Fever of 100F or higher  For urgent or emergent issues, a gastroenterologist can be reached at any hour by calling [phone]. Do not use MyChart messaging for urgent concerns.    DIET:  We do recommend a small meal at first, but then you may proceed to your regular diet.  Drink plenty of fluids but you should avoid alcoholic beverages for 24 hours.  ACTIVITY:  You should plan to take it easy for the rest of today and you should NOT DRIVE or use heavy machinery until tomorrow (because  of the sedation medicines used during the test).    FOLLOW UP: Our staff will call the number listed on your records 48-72 hours following your procedure to check on you and address any questions or concerns that you may have regarding the information given to you following your procedure. If we do not reach you, we will leave a message.  We will attempt to reach you two times.  During this call, we will ask if you have developed any symptoms of COVID 19. If you develop any symptoms (ie: fever, flu-like symptoms, shortness of breath, cough etc.) before then, please call [phone].  If you test positive for Covid 19 in the 2 weeks post procedure, please call and report this information to us.    If any biopsies were taken you will be contacted by phone or by letter within the next 1-3 weeks.  Please call us at [phone] if you have not heard about the biopsies in 3 weeks.    SIGNATURES/CONFIDENTIALITY: You and/or your care partner have signed paperwork which will be entered into your electronic medical record.  These signatures attest to the fact that that the information above on your After Visit Summary has been reviewed and is understood.  Full responsibility of the confidentiality of this discharge information lies with you and/or your care-partner. 

## 2020-01-30 NOTE — Op Note (Signed)
[facility] Patient Name: Christopher Mckenzie Procedure Date: 01/30/2020 2:53 PM MRN: [id number] Endoscopist: [place]. Loletha Carrow , MD Age: 50 Referring MD:  Date of Birth: 06/25/1971 Gender: Male Account #: [id number] Procedure:                Colonoscopy Indications:              Screening for colorectal malignant neoplasm, This                            is the patient's first colonoscopy Medicines:                Monitored Anesthesia Care Procedure:                Pre-Anesthesia Assessment:                           - Prior to the procedure, a History and Physical                            was performed, and patient medications and                            allergies were reviewed. The patient's tolerance of                            previous anesthesia was also reviewed. The risks                            and benefits of the procedure and the sedation                            options and risks were discussed with the patient.                            All questions were answered, and informed consent                            was obtained. Prior Anticoagulants: The patient has                            taken no previous anticoagulant or antiplatelet                            agents. ASA Grade Assessment: II - A patient with                            mild systemic disease. After reviewing the risks                            and benefits, the patient was deemed in                            satisfactory condition to undergo the procedure.  After obtaining informed consent, the colonoscope                            was passed under direct vision. Throughout the                            procedure, the patient's blood pressure, pulse, and                            oxygen saturations were monitored continuously. The                            Colonoscope was introduced through the anus and                            advanced to the the cecum,  identified by                            appendiceal orifice and ileocecal valve. The                            colonoscopy was performed without difficulty. The                            patient tolerated the procedure well. The quality                            of the bowel preparation was good. The ileocecal                            valve, appendiceal orifice, and rectum were                            photographed. The bowel preparation used was                            Miralax. Scope In: 3:07:46 PM Scope Out: 3:20:39 PM Scope Withdrawal Time: 0 hours 9 minutes 27 seconds  Total Procedure Duration: 0 hours 12 minutes 53 seconds  Findings:                 The perianal and digital rectal examinations were                            normal.                           The entire examined colon appeared normal on direct                            and retroflexion views. Complications:            No immediate complications. Estimated Blood Loss:     Estimated blood loss: none. Impression:               - The entire examined colon is normal on direct and  retroflexion views.                           - No specimens collected. Recommendation:           - Patient has a contact number available for                            emergencies. The signs and symptoms of potential                            delayed complications were discussed with the                            patient. Return to normal activities tomorrow.                            Written discharge instructions were provided to the                            patient.                           - Resume previous diet.                           - Continue present medications.                           - Repeat colonoscopy in 10 years for screening                            purposes. Christopher Mckenzie L. Loletha Carrow, MD 01/30/2020 3:23:16 PM This report has been signed electronically.

## 2020-02-03 ENCOUNTER — Telehealth: Payer: Self-pay | Admitting: *Deleted

## 2020-02-03 NOTE — Telephone Encounter (Signed)
No answer for post procedure call back. Left message for patient to call with questions or concerns. 

## 2020-02-03 NOTE — Telephone Encounter (Signed)
  Follow up Call-  Call back number 01/30/2020  Post procedure Call Back phone  # [phone]  Permission to leave phone message Yes  Some recent data might be hidden     First attempt for follow up phone call. No answer at number given.  Left message on voicemail.

## 2020-03-25 DIAGNOSIS — D1801 Hemangioma of skin and subcutaneous tissue: Secondary | ICD-10-CM | POA: Diagnosis not present

## 2020-03-25 DIAGNOSIS — L821 Other seborrheic keratosis: Secondary | ICD-10-CM | POA: Diagnosis not present

## 2020-03-25 DIAGNOSIS — D229 Melanocytic nevi, unspecified: Secondary | ICD-10-CM | POA: Diagnosis not present

## 2020-03-25 DIAGNOSIS — L814 Other melanin hyperpigmentation: Secondary | ICD-10-CM | POA: Diagnosis not present

## 2020-04-07 ENCOUNTER — Ambulatory Visit: Payer: BC Managed Care – PPO | Admitting: Endocrinology

## 2020-12-29 ENCOUNTER — Encounter: Payer: Self-pay | Admitting: *Deleted

## 2021-01-26 ENCOUNTER — Telehealth: Payer: Self-pay | Admitting: Endocrinology

## 2021-01-26 ENCOUNTER — Other Ambulatory Visit: Payer: Self-pay

## 2021-01-26 DIAGNOSIS — E0500 Thyrotoxicosis with diffuse goiter without thyrotoxic crisis or storm: Secondary | ICD-10-CM

## 2021-01-26 MED ORDER — METHIMAZOLE 5 MG PO TABS: 5.0000 mg | ORAL_TABLET | Freq: Every day | ORAL | 0 refills | Status: DC

## 2021-01-26 NOTE — Telephone Encounter (Signed)
methimazole (TAPAZOLE) 5 MG tablet  Pt is needing a refill on this medication, pt has an app on 03/25/2021 and was wondering if he could get a refill to last him up until this day.    [id number] [place] [address]

## 2021-03-09 ENCOUNTER — Telehealth: Payer: Self-pay | Admitting: Endocrinology

## 2021-03-09 NOTE — Telephone Encounter (Signed)
Pt's wife calling in stating that pt has an upcoming app at the end of September and needs a refill on    methimazole (TAPAZOLE) 5 MG tablet   [id number] [place] [address]

## 2021-03-10 ENCOUNTER — Other Ambulatory Visit: Payer: Self-pay

## 2021-03-10 DIAGNOSIS — E0500 Thyrotoxicosis with diffuse goiter without thyrotoxic crisis or storm: Secondary | ICD-10-CM

## 2021-03-10 MED ORDER — METHIMAZOLE 5 MG PO TABS: 5.0000 mg | ORAL_TABLET | Freq: Every day | ORAL | 0 refills | Status: DC

## 2021-03-10 NOTE — Telephone Encounter (Signed)
Sent refill to pharmacy for patient.

## 2021-03-25 ENCOUNTER — Other Ambulatory Visit: Payer: Self-pay

## 2021-03-25 ENCOUNTER — Other Ambulatory Visit (INDEPENDENT_AMBULATORY_CARE_PROVIDER_SITE_OTHER): Payer: BC Managed Care – PPO

## 2021-03-25 ENCOUNTER — Ambulatory Visit (INDEPENDENT_AMBULATORY_CARE_PROVIDER_SITE_OTHER): Payer: BC Managed Care – PPO | Admitting: Endocrinology

## 2021-03-25 VITALS — BP 100/80 | HR 70 | Ht 66.0 in | Wt 148.0 lb

## 2021-03-25 DIAGNOSIS — E119 Type 2 diabetes mellitus without complications: Secondary | ICD-10-CM

## 2021-03-25 DIAGNOSIS — E0500 Thyrotoxicosis with diffuse goiter without thyrotoxic crisis or storm: Secondary | ICD-10-CM | POA: Diagnosis not present

## 2021-03-25 DIAGNOSIS — Z8639 Personal history of other endocrine, nutritional and metabolic disease: Secondary | ICD-10-CM

## 2021-03-25 LAB — T4, FREE: Free T4: 0.89 ng/dL (ref 0.60–1.60)

## 2021-03-25 LAB — HEMOGLOBIN A1C: Hgb A1c MFr Bld: 5.6 % (ref 4.6–6.5)

## 2021-03-25 LAB — TSH: TSH: 0.32 u[IU]/mL — ABNORMAL LOW (ref 0.35–5.50)

## 2021-03-25 NOTE — Patient Instructions (Signed)
Blood tests are requested for you today.  We'll let you know about the results.   If ever you have fever while taking methimazole, stop it and call us, even if the reason is obvious, because of the risk of a rare side-effect.  It is best to never miss the medication.  However, if you do miss it, next best is to double up the next time.   Please come back for a follow-up appointment in 4 months.   

## 2021-03-25 NOTE — Progress Notes (Signed)
   Subjective:    Patient ID: Christopher Mckenzie, male    DOB: 1971-04-19, 50 y.o.   MRN: [id number]  HPI Pt returns for f/u of diabetes mellitus:  DM type: (1 was dx'ed due to [place] and lean body habitus), in remission.   Dx'ed: 5726 Complications: none Therapy: no medication DKA: never Severe hypoglycemia: never Pancreatitis: never Other: he has never been on medication for this, as glucose spontaneously normalized.   Interval history: he takes no diabetes medication.   Pt returns for f/u of hyperthyroidism (dx'ed 2035; uncertain etiology; he has never had imaging; he chose thionamide rx; he declines RAI, due to cost).  He takes tapazole as rx'ed.  pt states he feels well in general.   Past Medical History:  Diagnosis Date   ANOSMIA 05/13/2007   HYPERTHYROIDISM 08/29/2010   WEIGHT LOSS 08/26/2010    Past Surgical History:  Procedure Laterality Date   VASECTOMY  07/2007    Social History   Socioeconomic History   Marital status: Married    Spouse name: Not on file   Number of children: 1   Years of education: Not on file   Highest education level: Not on file  Occupational History    Employer: [employer]  Tobacco Use   Smoking status: Never   Smokeless tobacco: Never  Vaping Use   Vaping Use: Never used  Substance and Sexual Activity   Alcohol use: Yes    Alcohol/week: 1.0 standard drink    Types: 1 Cans of beer per week   Drug use: No   Sexual activity: Yes  Other Topics Concern   Not on file  Social History Narrative   Originally from [place], In [place] x many years   Regular Exercise-Yes   Married 1 child-2007   Social Determinants of Health   Financial Resource Strain: Not on file  Food Insecurity: Not on file  Transportation Needs: Not on file  Physical Activity: Not on file  Stress: Not on file  Social Connections: Not on file  Intimate Partner Violence: Not on file    Current Outpatient Medications on File Prior to Visit  Medication Sig Dispense Refill    methimazole (TAPAZOLE) 5 MG tablet Take 1 tablet (5 mg total) by mouth daily. 60 tablet 0   No current facility-administered medications on file prior to visit.    No Known Allergies  Family History  Problem Relation Age of Onset   Kidney disease Mother        Kidney Cancer   Cancer Mother        thyroid and kidney cancer   Diabetes Father    Heart disease Neg Hx    Colon cancer Neg Hx    Colon polyps Neg Hx    Stomach cancer Neg Hx    Esophageal cancer Neg Hx     BP 100/80 (BP Location: Right Arm, Patient Position: Sitting, Cuff Size: Normal)   Pulse 70   Ht 5\' 6"  (1.676 m)   Wt 148 lb (67.1 kg)   SpO2 97%   BMI 23.89 kg/m    Review of Systems Denies fever.      Objective:   Physical Exam NECK: thyroid is slightly and diffusely enlarged.    Lab Results  Component Value Date   TSH 0.32 (L) 03/25/2021       Assessment & Plan:  Hyperthyroidism: uncontrolled.  In view of variable TFT without change in tapazole dosage.   DM: recheck A1c

## 2021-04-27 DIAGNOSIS — D485 Neoplasm of uncertain behavior of skin: Secondary | ICD-10-CM | POA: Diagnosis not present

## 2021-04-27 DIAGNOSIS — L570 Actinic keratosis: Secondary | ICD-10-CM | POA: Diagnosis not present

## 2021-05-16 ENCOUNTER — Other Ambulatory Visit: Payer: Self-pay | Admitting: Endocrinology

## 2021-05-16 DIAGNOSIS — E0500 Thyrotoxicosis with diffuse goiter without thyrotoxic crisis or storm: Secondary | ICD-10-CM

## 2021-05-16 MED ORDER — METHIMAZOLE 5 MG PO TABS: 5.0000 mg | ORAL_TABLET | Freq: Every day | ORAL | 0 refills | Status: DC

## 2021-06-13 ENCOUNTER — Encounter: Payer: Self-pay | Admitting: Family Medicine

## 2021-06-13 ENCOUNTER — Ambulatory Visit (INDEPENDENT_AMBULATORY_CARE_PROVIDER_SITE_OTHER): Payer: BC Managed Care – PPO | Admitting: Family Medicine

## 2021-06-13 VITALS — BP 110/68 | HR 64 | Temp 98.1°F | Resp 16 | Ht 66.0 in | Wt 151.2 lb

## 2021-06-13 DIAGNOSIS — Z125 Encounter for screening for malignant neoplasm of prostate: Secondary | ICD-10-CM | POA: Diagnosis not present

## 2021-06-13 DIAGNOSIS — Z1159 Encounter for screening for other viral diseases: Secondary | ICD-10-CM | POA: Diagnosis not present

## 2021-06-13 DIAGNOSIS — Z0000 Encounter for general adult medical examination without abnormal findings: Secondary | ICD-10-CM | POA: Diagnosis not present

## 2021-06-13 DIAGNOSIS — Z114 Encounter for screening for human immunodeficiency virus [HIV]: Secondary | ICD-10-CM

## 2021-06-13 DIAGNOSIS — Z8639 Personal history of other endocrine, nutritional and metabolic disease: Secondary | ICD-10-CM | POA: Diagnosis not present

## 2021-06-13 LAB — LIPID PANEL
Cholesterol: 193 mg/dL (ref 0–200)
HDL: 50.1 mg/dL (ref >39.00)
LDL Cholesterol: 125 mg/dL — ABNORMAL HIGH (ref 0–99)
NonHDL: 142.68
Total CHOL/HDL Ratio: 4
Triglycerides: 88 mg/dL (ref 0.0–149.0)
VLDL: 17.6 mg/dL (ref 0.0–40.0)

## 2021-06-13 LAB — CBC WITH DIFFERENTIAL/PLATELET
Basophils Absolute: 0 10*3/uL (ref 0.0–0.1)
Basophils Relative: 0.7 % (ref 0.0–3.0)
Eosinophils Absolute: 0.1 10*3/uL (ref 0.0–0.7)
Eosinophils Relative: 3.4 % (ref 0.0–5.0)
HCT: 42.3 % (ref 39.0–52.0)
Hemoglobin: 14 g/dL (ref 13.0–17.0)
Lymphocytes Relative: 33.3 % (ref 12.0–46.0)
Lymphs Abs: 1.4 10*3/uL (ref 0.7–4.0)
MCHC: 33.1 g/dL (ref 30.0–36.0)
MCV: 84.1 fl (ref 78.0–100.0)
Monocytes Absolute: 0.3 10*3/uL (ref 0.1–1.0)
Monocytes Relative: 7.1 % (ref 3.0–12.0)
Neutro Abs: 2.3 10*3/uL (ref 1.4–7.7)
Neutrophils Relative %: 55.5 % (ref 43.0–77.0)
Platelets: 224 10*3/uL (ref 150.0–400.0)
RBC: 5.03 Mil/uL (ref 4.22–5.81)
RDW: 13.9 % (ref 11.5–15.5)
WBC: 4.2 10*3/uL (ref 4.0–10.5)

## 2021-06-13 LAB — HEPATIC FUNCTION PANEL
ALT: 30 U/L (ref 0–53)
AST: 22 U/L (ref 0–37)
Albumin: 4.5 g/dL (ref 3.5–5.2)
Alkaline Phosphatase: 58 U/L (ref 39–117)
Bilirubin, Direct: 0.1 mg/dL (ref 0.0–0.3)
Total Bilirubin: 0.7 mg/dL (ref 0.2–1.2)
Total Protein: 6.7 g/dL (ref 6.0–8.3)

## 2021-06-13 LAB — BASIC METABOLIC PANEL
BUN: 17 mg/dL (ref 6–23)
CO2: 27 mEq/L (ref 19–32)
Calcium: 9.7 mg/dL (ref 8.4–10.5)
Chloride: 103 mEq/L (ref 96–112)
Creatinine, Ser: 0.99 mg/dL (ref 0.40–1.50)
GFR: 89.21 mL/min (ref >60.00)
Glucose, Bld: 86 mg/dL (ref 70–99)
Potassium: 4.5 mEq/L (ref 3.5–5.1)
Sodium: 138 mEq/L (ref 135–145)

## 2021-06-13 LAB — TSH: TSH: 0.92 u[IU]/mL (ref 0.35–5.50)

## 2021-06-13 LAB — PSA: PSA: 1.12 ng/mL (ref 0.10–4.00)

## 2021-06-13 NOTE — Progress Notes (Signed)
   Subjective:    Patient ID: Christopher Mckenzie, male    DOB: 06-18-1971, 50 y.o.   MRN: [id number]  HPI CPE- UTD on Tdap, flu, colonoscopy, foot exam.  No concerns today  Patient Care Team    Relationship Specialty Notifications Start End  Midge Minium, MD PCP - General Family Medicine  06/13/17   Doran Stabler, MD Consulting Physician Gastroenterology  06/13/21   Renato Shin, MD Consulting Physician Endocrinology  06/13/21      Health Maintenance  Topic Date Due   Pneumococcal Vaccine 43-39 Years old (1 - PCV) Never done   HIV Screening  Never done   Hepatitis C Screening  Never done   COVID-19 Vaccine (4 - Booster for Pfizer series) 08/06/2020   TETANUS/TDAP  12/04/2029   COLONOSCOPY (Pts 45-79yrs Insurance coverage will need to be confirmed)  01/29/2030   INFLUENZA VACCINE  Completed   HPV VACCINES  Aged Out       Review of Systems Patient reports no vision/hearing changes, anorexia, fever ,adenopathy, persistant/recurrent hoarseness, swallowing issues, chest pain, palpitations, edema, persistant/recurrent cough, hemoptysis, dyspnea (rest,exertional, paroxysmal nocturnal), gastrointestinal  bleeding (melena, rectal bleeding), abdominal pain, excessive heart burn, GU symptoms (dysuria, hematuria, voiding/incontinence issues) syncope, focal weakness, memory loss, numbness & tingling, skin/hair/nail changes, depression, anxiety, abnormal bruising/bleeding, musculoskeletal symptoms/signs.   This visit occurred during the SARS-CoV-2 public health emergency.  Safety protocols were in place, including screening questions prior to the visit, additional usage of staff PPE, and extensive cleaning of exam room while observing appropriate contact time as indicated for disinfecting solutions.      Objective:   Physical Exam General Appearance:    Alert, cooperative, no distress, appears stated age  Head:    Normocephalic, without obvious abnormality, atraumatic  Eyes:    PERRL,  conjunctiva/corneas clear, EOM's intact, fundi    benign, both eyes       Ears:    Normal TM's and external ear canals, both ears  Nose:   Deferred due to COVID  Throat:   Neck:   Supple, symmetrical, trachea midline, no adenopathy;       thyroid:  No enlargement/tenderness/nodules  Back:     Symmetric, no curvature, ROM normal, no CVA tenderness  Lungs:     Clear to auscultation bilaterally, respirations unlabored  Chest wall:    No tenderness or deformity  Heart:    Regular rate and rhythm, S1 and S2 normal, no murmur, rub   or gallop  Abdomen:     Soft, non-tender, bowel sounds active all four quadrants,    no masses, no organomegaly  Genitalia:    deferred  Rectal:    Extremities:   Extremities normal, atraumatic, no cyanosis or edema  Pulses:   2+ and symmetric all extremities  Skin:   Skin color, texture, turgor normal, no rashes or lesions  Lymph nodes:   Cervical, supraclavicular, and axillary nodes normal  Neurologic:   CNII-XII intact. Normal strength, sensation and reflexes      throughout          Assessment & Plan:

## 2021-06-13 NOTE — Assessment & Plan Note (Signed)
Pt's PE WNL.  UTD on colonoscopy, flu, Tdap, COVID.  Check labs.  Anticipatory guidance provided.

## 2021-06-13 NOTE — Patient Instructions (Signed)
Follow up in 1 year or as needed We'll notify you of your lab results and make any changes if needed Start a once daily multivitamin (available over the counter) Keep up the good work on healthy diet and regular exercise- you look great! Call with any questions or concerns Stay Safe!  Stay Healthy! Happy Holidays!! ([place]!!!!)

## 2021-06-13 NOTE — Assessment & Plan Note (Signed)
Pt's most recent A1C is excellent at 5.6%.  Follows w/ Endo for his hyperthyroidism.

## 2021-06-14 LAB — HEPATITIS C ANTIBODY
Hepatitis C Ab: NONREACTIVE
SIGNAL TO CUT-OFF: <0.02 (ref <1.00)

## 2021-06-14 LAB — HIV ANTIBODY (ROUTINE TESTING W REFLEX): HIV 1&2 Ab, 4th Generation: NONREACTIVE

## 2021-07-29 ENCOUNTER — Ambulatory Visit: Payer: BC Managed Care – PPO | Admitting: Endocrinology

## 2021-09-04 ENCOUNTER — Other Ambulatory Visit: Payer: Self-pay | Admitting: Endocrinology

## 2021-09-04 DIAGNOSIS — E0500 Thyrotoxicosis with diffuse goiter without thyrotoxic crisis or storm: Secondary | ICD-10-CM

## 2021-09-07 ENCOUNTER — Encounter: Payer: Self-pay | Admitting: Endocrinology

## 2021-09-07 ENCOUNTER — Other Ambulatory Visit: Payer: Self-pay

## 2021-09-07 ENCOUNTER — Ambulatory Visit (INDEPENDENT_AMBULATORY_CARE_PROVIDER_SITE_OTHER): Payer: BC Managed Care – PPO | Admitting: Endocrinology

## 2021-09-07 VITALS — BP 126/70 | HR 73 | Ht 66.0 in | Wt 152.0 lb

## 2021-09-07 DIAGNOSIS — E119 Type 2 diabetes mellitus without complications: Secondary | ICD-10-CM | POA: Diagnosis not present

## 2021-09-07 DIAGNOSIS — E0500 Thyrotoxicosis with diffuse goiter without thyrotoxic crisis or storm: Secondary | ICD-10-CM

## 2021-09-07 HISTORY — DX: Type 2 diabetes mellitus without complications: E11.9

## 2021-09-07 NOTE — Patient Instructions (Signed)
Blood tests are requested for you today.  We'll let you know about the results.   ?If ever you have fever while taking methimazole, stop it and call us, even if the reason is obvious, because of the risk of a rare side-effect. ?It is best to never miss the medication.  However, if you do miss it, next best is to double up the next time.   ?Please come back for a follow-up appointment in 6 months.   ?

## 2021-09-07 NOTE — Progress Notes (Signed)
? ?Subjective:  ? ? Patient ID: Christopher Mckenzie, male    DOB: October 21, 1970, 51 y.o.   MRN: [id number] ? ?HPI ?Pt returns for f/u of diabetes mellitus:  ?DM type: (1 was dx'ed due to Why and lean body habitus), in remission.   ?Dx'ed: 2011 ?Complications: none ?Therapy: no medication ?DKA: never ?Severe hypoglycemia: never ?Pancreatitis: never ?Other: he has never been on medication for this, as glucose spontaneously normalized.   ?Interval history: he takes no diabetes medication.   ?Pt returns for f/u of hyperthyroidism (dx'ed 6967; uncertain etiology; he has never had imaging; he chose thionamide rx; he declines RAI, due to cost).  He takes tapazole as rx'ed.  pt states he feels well in general.  Specifically, he denies palpitations and tremor.   ?Past Medical History:  ?Diagnosis Date  ? ANOSMIA 05/13/2007  ? Diabetes ([place]) 09/07/2021  ? HYPERTHYROIDISM 08/29/2010  ? Skin cancer of face   ? WEIGHT LOSS 08/26/2010  ? ? ?Past Surgical History:  ?Procedure Laterality Date  ? VASECTOMY  07/2007  ? ? ?Social History  ? ?Socioeconomic History  ? Marital status: Married  ?  Spouse name: Not on file  ? Number of children: 1  ? Years of education: Not on file  ? Highest education level: Not on file  ?Occupational History  ?  Employer: [employer]  ?Tobacco Use  ? Smoking status: Never  ? Smokeless tobacco: Never  ?Vaping Use  ? Vaping Use: Never used  ?Substance and Sexual Activity  ? Alcohol use: Yes  ?  Alcohol/week: 1.0 standard drink  ?  Types: 1 Cans of beer per week  ? Drug use: No  ? Sexual activity: Yes  ?Other Topics Concern  ? Not on file  ?Social History Narrative  ? Originally from [place], [place] x many years  ? Regular Exercise-Yes  ? Married 1 child-2007  ? ?Social Determinants of Health  ? ?Financial Resource Strain: Not on file  ?Food Insecurity: Not on file  ?Transportation Needs: Not on file  ?Physical Activity: Not on file  ?Stress: Not on file  ?Social Connections: Not on file  ?Intimate Partner Violence:  Not on file  ? ? ?No current outpatient medications on file prior to visit.  ? ?No current facility-administered medications on file prior to visit.  ? ? ?No Known Allergies ? ?Family History  ?Problem Relation Age of Onset  ? Kidney disease Mother   ?     Kidney Cancer  ? Cancer Mother   ?     thyroid and kidney cancer  ? Diabetes Father   ? Heart disease Neg Hx   ? Colon cancer Neg Hx   ? Colon polyps Neg Hx   ? Stomach cancer Neg Hx   ? Esophageal cancer Neg Hx   ? ? ?BP 126/70 (BP Location: Left Arm, Patient Position: Sitting, Cuff Size: Normal)   Pulse 73   Ht '5\' 6"'$  (1.676 m)   Wt 152 lb (68.9 kg)   SpO2 97%   BMI 24.53 kg/m?  ? ? ?Review of Systems ?Denies fever ?   ?Objective:  ? Physical Exam ?VITAL SIGNS:  See vs page ?GENERAL: no distress ?NECK: There is no palpable thyroid enlargement.  No thyroid nodule is palpable.  No palpable lymphadenopathy at the anterior neck.   ? ? ?Lab Results  ?Component Value Date  ? CREATININE 0.99 06/13/2021  ? BUN 17 06/13/2021  ? NA 138 06/13/2021  ? K 4.5 06/13/2021  ?  CL 103 06/13/2021  ? CO2 27 06/13/2021  ? ?Lab Results  ?Component Value Date  ? HGBA1C 5.6 09/07/2021  ? ?Lab Results  ?Component Value Date  ? TSH 0.86 09/07/2021  ? ? ?   ?Assessment & Plan:  ?Hyperthyroidism: well-controlled.  Please continue the same methimazole.  ?DM, in remission.  Plan is to follow, as he is at risk for recurrence.   ? ?Patient Instructions  ?Blood tests are requested for you today.  We'll let you know about the results.   ?If ever you have fever while taking methimazole, stop it and call us, even if the reason is obvious, because of the risk of a rare side-effect. ?It is best to never miss the medication.  However, if you do miss it, next best is to double up the next time.   ?Please come back for a follow-up appointment in 6 months.   ? ? ?

## 2021-09-08 LAB — HEMOGLOBIN A1C: Hgb A1c MFr Bld: 5.6 % (ref 4.6–6.5)

## 2021-09-08 LAB — TSH: TSH: 0.86 u[IU]/mL (ref 0.35–5.50)

## 2021-09-08 LAB — T4, FREE: Free T4: 0.97 ng/dL (ref 0.60–1.60)

## 2021-09-10 MED ORDER — METHIMAZOLE 5 MG PO TABS: 5.0000 mg | ORAL_TABLET | Freq: Every day | ORAL | 3 refills | Status: DC

## 2021-10-07 ENCOUNTER — Encounter: Payer: Self-pay | Admitting: Family Medicine

## 2022-03-14 ENCOUNTER — Encounter: Payer: Self-pay | Admitting: Internal Medicine

## 2022-03-14 ENCOUNTER — Ambulatory Visit (INDEPENDENT_AMBULATORY_CARE_PROVIDER_SITE_OTHER): Payer: BC Managed Care – PPO | Admitting: Internal Medicine

## 2022-03-14 VITALS — BP 100/58 | HR 68 | Ht 66.0 in | Wt 149.0 lb

## 2022-03-14 DIAGNOSIS — Z8639 Personal history of other endocrine, nutritional and metabolic disease: Secondary | ICD-10-CM

## 2022-03-14 DIAGNOSIS — E0500 Thyrotoxicosis with diffuse goiter without thyrotoxic crisis or storm: Secondary | ICD-10-CM

## 2022-03-14 LAB — POCT GLYCOSYLATED HEMOGLOBIN (HGB A1C): Hemoglobin A1C: 5.2 % (ref 4.0–5.6)

## 2022-03-14 NOTE — Progress Notes (Addendum)
Patient ID: Lawrnce Mckenzie, male   DOB: June 16, 1971, 51 y.o.   MRN: [id number]  HPI  Christopher Mckenzie is a 51 y.o.-year-old male, returning for follow-up for thyrotoxicosis with presumed diagnosis of Graves' disease.  He previously saw Dr. Loanne Mckenzie, last visit 6 months ago. He is here with his wife who offers most of the history especially related to past medical history, timeline of patient's thyroid disease and medication dosing.  Patient was found to have a suppressed TSH  in 2011 after he presented with significant weight loss.  He was referred to Dr. Loanne Mckenzie at that time.  Graves' disease was presumed.  He was started on methimazole, and had several dose changes over the years, currently on 5 mg daily.    I reviewed pt's thyroid tests: Lab Results  Component Value Date   TSH 0.86 09/07/2021   TSH 0.92 06/13/2021   TSH 0.32 (L) 03/25/2021   TSH 7.32 (H) 12/05/2019   TSH 0.99 12/23/2018   TSH 1.49 08/06/2018   TSH 7.22 (H) 04/29/2018   TSH 5.36 (H) 11/13/2016   TSH 1.98 08/06/2015   TSH 0.14 (L) 07/29/2014   FREET4 0.97 09/07/2021   FREET4 0.89 03/25/2021   FREET4 1.02 08/06/2018   FREET4 0.79 04/29/2018   FREET4 0.75 11/13/2016   FREET4 0.93 08/06/2015   FREET4 1.12 07/29/2014   FREET4 0.87 04/03/2014   FREET4 0.75 01/27/2011   FREET4 0.87 10/18/2010   T3FREE 14.6 (H) 08/26/2010   Antithyroid antibodies: No results found for: "TSI"  Pt denies: - feeling nodules in neck - hoarseness - dysphagia - choking - SOB with lying down  He denies: - fatigue - excessive sweating/heat intolerance - tremors - anxiety - palpitations - hyperdefecation - recent weight loss  + FH of thyroid cancer - s/p surgery and RAI Tx (dx'ed at 51 y/o). No h/o radiation tx to head or neck. No seaweed or kelp, no recent contrast studies. No steroid use. No herbal supplements. No Biotin use.  He was also dx'ed with DM at the same time when he was diagnosed with Graves' disease, as, at that time, HbA1c  was high, at 7.5%.  Afterwards, HbA1c level normalized: Lab Results  Component Value Date   HGBA1C 5.6 09/07/2021   HGBA1C 5.6 03/25/2021   HGBA1C 5.1 12/05/2019   HGBA1C 5.5 12/23/2018   HGBA1C 5.2 04/29/2018   HGBA1C 5.4 11/13/2016   HGBA1C 5.5 08/06/2015   HGBA1C 5.6 07/29/2014   HGBA1C 5.8 05/12/2013   HGBA1C 5.5 11/27/2012   HGBA1C 5.4 12/01/2011   HGBA1C 5.3 11/17/2010   HGBA1C 7.5 (H) 08/26/2010   No increased urination, blurry vision, nausea, chest pain.  ROS: + see HPI  Past Medical History:  Diagnosis Date   ANOSMIA 05/13/2007   Diabetes ([place]) 09/07/2021   HYPERTHYROIDISM 08/29/2010   Skin cancer of face    WEIGHT LOSS 08/26/2010   Past Surgical History:  Procedure Laterality Date   VASECTOMY  07/2007   Social History   Socioeconomic History   Marital status: Married    Spouse name: Not on file   Number of children: 1   Years of education: Not on file   Highest education level: Not on file  Occupational History    Employer: [employer]  Tobacco Use   Smoking status: Never   Smokeless tobacco: Never  Vaping Use   Vaping Use: Never used  Substance and Sexual Activity   Alcohol use: Yes    Alcohol/week: 1.0 standard drink of alcohol  Types: 1 Cans of beer per week   Drug use: No   Sexual activity: Yes  Other Topics Concern   Not on file  Social History Narrative   Originally from [place], In [place] x many years   Regular Exercise-Yes   Married 1 child-2007   Social Determinants of Health   Financial Resource Strain: Not on file  Food Insecurity: Not on file  Transportation Needs: Not on file  Physical Activity: Not on file  Stress: Not on file  Social Connections: Not on file  Intimate Partner Violence: Not on file   Current Outpatient Medications on File Prior to Visit  Medication Sig Dispense Refill   methimazole (TAPAZOLE) 5 MG tablet Take 1 tablet (5 mg total) by mouth daily. 90 tablet 3   No current facility-administered medications on  file prior to visit.   No Known Allergies Family History  Problem Relation Age of Onset   Kidney disease Mother        Kidney Cancer   Cancer Mother        thyroid and kidney cancer   Diabetes Father    Heart disease Neg Hx    Colon cancer Neg Hx    Colon polyps Neg Hx    Stomach cancer Neg Hx    Esophageal cancer Neg Hx    PE: BP (!) 100/58 (BP Location: Right Arm, Patient Position: Sitting, Cuff Size: Normal)   Pulse 68   Ht '5\' 6"'$  (1.676 m)   Wt 149 lb (67.6 kg)   SpO2 98%   BMI 24.05 kg/m  Wt Readings from Last 3 Encounters:  03/14/22 149 lb (67.6 kg)  09/07/21 152 lb (68.9 kg)  06/13/21 151 lb 3.2 oz (68.6 kg)   Constitutional: normal weight, in NAD Eyes: EOMI, no exophthalmos, no lid lag, no stare ENT: moist mucous membranes, no thyromegaly, no cervical lymphadenopathy Cardiovascular: RRR, No MRG Respiratory: CTA B Musculoskeletal: no deformities Skin: moist, warm, no rashes Neurological: no tremor with outstretched hands, DTR normal in all 4  ASSESSMENT: 1.  Graves disease  2. H/o hyperglycemia  - presumed DM1 based on body habitus and concurrent Graves' disease  PLAN:  1. Patient with a h/o low TSH, with initial significant weight loss, presumed to have Graves' disease.  I cannot find TSIs or TRAb levels in the chart and patient did not have thyroid imaging.  However, I discussed with the patient and his wife that Christopher Mckenzie' disease is most likely due to the protracted course and response to methimazole. - at today's visit, we discussed about the etiology of Graves' disease, possible complications including Graves ophthalmopathy, and, if the TFTs are not controlled, cardiac and bone complications. - we also discussed about possible treatments to include continuing low-dose methimazole, which is now accepted as a long-term method of treatment with a better quality of life compared to RAI treatment and thyroidectomy. -  for now, we will check a TSH, free T4, free T3  and see if we can decrease the methimazole dose.  He did have some changes at the methimazole dose over the years and patient's wife remembers that he had dropped the dose to every other day at one point.  I would suggest to decrease the dose only if TSH increases in the upper half of the target range. - I do not feel we need any imaging test for now - also, I do not feel that we need to add beta blockers at this time, since he is not  tachycardic or tremulous - no signs of Graves' ophthalmopathy: he does not have any double vision, blurry vision, eye pain, chemosis. - RTC in 6 months, but possibly sooner for repeat labs  2. H/o hyperglycemia - he had an elevated HbA1c, at 7.5% at the time of his diagnosis with Graves' disease.  At that time, he was losing weight.  We discussed that increased blood sugars and subsequently HbA1c can happen in the situation but this did not give him a diagnosis of diabetes especially since was just 1 reading (not confirmed) and all of his HbA1c levels were normal afterwards but never taking any medications for this - today, HbA1c is 5.2%, excellent! - No further testing required other than checking HbA1c's yearly going forward.  Component     Latest Ref Rng 03/14/2022  Hemoglobin A1C     4.0 - 5.6 % 5.2   TSH     0.35 - 5.50 uIU/mL 0.75   T4,Free(Direct)     0.60 - 1.60 ng/dL 0.94   Triiodothyronine,Free,Serum     2.3 - 4.2 pg/mL 3.2   TSI     <140 % baseline 124   Labs are all normal. Due to the TSH being closer to the lower limit of the target range, I would suggest to continue the same dose of methimazole for now.  Philemon Kingdom, MD PhD [facility] Endocrinology

## 2022-03-14 NOTE — Patient Instructions (Signed)
Please continue Methimazole 5 mg daily.  Please stop at the lab.  Please return in 6 months.

## 2022-03-15 LAB — TSH: TSH: 0.75 u[IU]/mL (ref 0.35–5.50)

## 2022-03-15 LAB — T3, FREE: T3, Free: 3.2 pg/mL (ref 2.3–4.2)

## 2022-03-15 LAB — T4, FREE: Free T4: 0.94 ng/dL (ref 0.60–1.60)

## 2022-03-16 LAB — THYROID STIMULATING IMMUNOGLOBULIN: TSI: 124 % baseline (ref <140)

## 2022-09-12 ENCOUNTER — Ambulatory Visit: Payer: BC Managed Care – PPO | Admitting: Internal Medicine

## 2022-09-14 ENCOUNTER — Ambulatory Visit (INDEPENDENT_AMBULATORY_CARE_PROVIDER_SITE_OTHER): Payer: BC Managed Care – PPO | Admitting: Family Medicine

## 2022-09-14 ENCOUNTER — Encounter: Payer: Self-pay | Admitting: Family Medicine

## 2022-09-14 VITALS — BP 114/62 | HR 75 | Temp 97.8°F | Resp 17 | Ht 66.0 in | Wt 151.2 lb

## 2022-09-14 DIAGNOSIS — Z125 Encounter for screening for malignant neoplasm of prostate: Secondary | ICD-10-CM

## 2022-09-14 DIAGNOSIS — Z23 Encounter for immunization: Secondary | ICD-10-CM

## 2022-09-14 DIAGNOSIS — Z0000 Encounter for general adult medical examination without abnormal findings: Secondary | ICD-10-CM | POA: Diagnosis not present

## 2022-09-14 DIAGNOSIS — Z8639 Personal history of other endocrine, nutritional and metabolic disease: Secondary | ICD-10-CM

## 2022-09-14 NOTE — Assessment & Plan Note (Addendum)
Pt's PE WNL.  UTD on colonoscopy, Tdap, flu.  Shingles shot given.  Check labs.  Anticipatory guidance provided.

## 2022-09-14 NOTE — Progress Notes (Signed)
   Subjective:    Patient ID: Christopher Mckenzie, male    DOB: 09-Jul-1970, 52 y.o.   MRN: [id number]  HPI CPE- UTD on Tdap, colonoscopy, flu.  Will get shingles today.  No concerns today  Patient Care Team    Relationship Specialty Notifications Start End  Midge Minium, MD PCP - General Family Medicine  06/13/17   Doran Stabler, MD Consulting Physician Gastroenterology  06/13/21   Renato Shin, MD (Inactive) Consulting Physician Endocrinology  06/13/21      Health Maintenance  Topic Date Due   Zoster Vaccines- Shingrix (1 of 2) Never done   Diabetic kidney evaluation - eGFR measurement  06/13/2023 (Originally 06/13/2022)   Diabetic kidney evaluation - Urine ACR  09/14/2027 (Originally 07/24/1989)   DTaP/Tdap/Td (5 - Td or Tdap) 12/04/2029   COLONOSCOPY (Pts 45-62yrs Insurance coverage will need to be confirmed)  01/29/2030   INFLUENZA VACCINE  Completed   Hepatitis C Screening  Completed   HIV Screening  Completed   HPV VACCINES  Aged Out   COVID-19 Vaccine  Discontinued     Review of Systems Patient reports no vision/hearing changes, anorexia, fever ,adenopathy, persistant/recurrent hoarseness, swallowing issues, chest pain, palpitations, edema, persistant/recurrent cough, hemoptysis, dyspnea (rest,exertional, paroxysmal nocturnal), gastrointestinal  bleeding (melena, rectal bleeding), abdominal pain, excessive heart burn, GU symptoms (dysuria, hematuria, voiding/incontinence issues) syncope, focal weakness, memory loss, numbness & tingling, skin/hair/nail changes, depression, anxiety, abnormal bruising/bleeding, musculoskeletal symptoms/signs.     Objective:   Physical Exam General Appearance:    Alert, cooperative, no distress, appears stated age  Head:    Normocephalic, without obvious abnormality, atraumatic  Eyes:    PERRL, conjunctiva/corneas clear, EOM's intact both eyes       Ears:    Normal TM's and external ear canals, both ears  Nose:   Nares normal, septum  midline, mucosa normal, no drainage   or sinus tenderness  Throat:   Lips, mucosa, and tongue normal; teeth and gums normal  Neck:   Supple, symmetrical, trachea midline, no adenopathy;       thyroid:  No enlargement/tenderness/nodules  Back:     Symmetric, no curvature, ROM normal, no CVA tenderness  Lungs:     Clear to auscultation bilaterally, respirations unlabored  Chest wall:    No tenderness or deformity  Heart:    Regular rate and rhythm, S1 and S2 normal, no murmur, rub   or gallop  Abdomen:     Soft, non-tender, bowel sounds active all four quadrants,    no masses, no organomegaly  Genitalia:    deferred  Rectal:    Extremities:   Extremities normal, atraumatic, no cyanosis or edema  Pulses:   2+ and symmetric all extremities  Skin:   Skin color, texture, turgor normal, no rashes or lesions  Lymph nodes:   Cervical, supraclavicular, and axillary nodes normal  Neurologic:   CNII-XII intact. Normal strength, sensation and reflexes      throughout          Assessment & Plan:

## 2022-09-14 NOTE — Patient Instructions (Signed)
Follow up in 1 year or as needed ?We'll notify you of your lab results and make any changes if needed ?Keep up the good work!  You look great! ?Call with any questions or concerns ?Stay Safe!  Stay Healthy!! ?Happy Spring!! ?

## 2022-09-15 ENCOUNTER — Telehealth: Payer: Self-pay

## 2022-09-15 LAB — CBC WITH DIFFERENTIAL/PLATELET
Basophils Absolute: 0 10*3/uL (ref 0.0–0.1)
Basophils Relative: 0.9 % (ref 0.0–3.0)
Eosinophils Absolute: 0.2 10*3/uL (ref 0.0–0.7)
Eosinophils Relative: 4.9 % (ref 0.0–5.0)
HCT: 40.2 % (ref 39.0–52.0)
Hemoglobin: 13.5 g/dL (ref 13.0–17.0)
Lymphocytes Relative: 35.8 % (ref 12.0–46.0)
Lymphs Abs: 1.8 10*3/uL (ref 0.7–4.0)
MCHC: 33.6 g/dL (ref 30.0–36.0)
MCV: 83.5 fl (ref 78.0–100.0)
Monocytes Absolute: 0.4 10*3/uL (ref 0.1–1.0)
Monocytes Relative: 7.3 % (ref 3.0–12.0)
Neutro Abs: 2.6 10*3/uL (ref 1.4–7.7)
Neutrophils Relative %: 51.1 % (ref 43.0–77.0)
Platelets: 233 10*3/uL (ref 150.0–400.0)
RBC: 4.81 Mil/uL (ref 4.22–5.81)
RDW: 13.9 % (ref 11.5–15.5)
WBC: 5.1 10*3/uL (ref 4.0–10.5)

## 2022-09-15 LAB — BASIC METABOLIC PANEL
BUN: 20 mg/dL (ref 6–23)
CO2: 26 mEq/L (ref 19–32)
Calcium: 9.2 mg/dL (ref 8.4–10.5)
Chloride: 104 mEq/L (ref 96–112)
Creatinine, Ser: 1.17 mg/dL (ref 0.40–1.50)
GFR: 72.36 mL/min (ref >60.00)
Glucose, Bld: 90 mg/dL (ref 70–99)
Potassium: 4.4 mEq/L (ref 3.5–5.1)
Sodium: 138 mEq/L (ref 135–145)

## 2022-09-15 LAB — HEPATIC FUNCTION PANEL
ALT: 27 U/L (ref 0–53)
AST: 23 U/L (ref 0–37)
Albumin: 4.2 g/dL (ref 3.5–5.2)
Alkaline Phosphatase: 55 U/L (ref 39–117)
Bilirubin, Direct: 0.1 mg/dL (ref 0.0–0.3)
Total Bilirubin: 0.6 mg/dL (ref 0.2–1.2)
Total Protein: 6.5 g/dL (ref 6.0–8.3)

## 2022-09-15 LAB — TSH: TSH: 1.68 u[IU]/mL (ref 0.35–5.50)

## 2022-09-15 LAB — LIPID PANEL
Cholesterol: 185 mg/dL (ref 0–200)
HDL: 43.1 mg/dL (ref >39.00)
LDL Cholesterol: 114 mg/dL — ABNORMAL HIGH (ref 0–99)
NonHDL: 142.19
Total CHOL/HDL Ratio: 4
Triglycerides: 141 mg/dL (ref 0.0–149.0)
VLDL: 28.2 mg/dL (ref 0.0–40.0)

## 2022-09-15 LAB — PSA: PSA: 1.36 ng/mL (ref 0.10–4.00)

## 2022-09-15 NOTE — Telephone Encounter (Signed)
Left pt results on his VM

## 2022-09-15 NOTE — Telephone Encounter (Signed)
-----   Message from Midge Minium, MD sent at 09/15/2022  2:49 PM EDT ----- Labs look great!!  No changes at this time

## 2022-10-04 DIAGNOSIS — L821 Other seborrheic keratosis: Secondary | ICD-10-CM | POA: Diagnosis not present

## 2022-10-04 DIAGNOSIS — L814 Other melanin hyperpigmentation: Secondary | ICD-10-CM | POA: Diagnosis not present

## 2022-10-04 DIAGNOSIS — D225 Melanocytic nevi of trunk: Secondary | ICD-10-CM | POA: Diagnosis not present

## 2022-10-04 DIAGNOSIS — L570 Actinic keratosis: Secondary | ICD-10-CM | POA: Diagnosis not present

## 2023-02-15 ENCOUNTER — Encounter (INDEPENDENT_AMBULATORY_CARE_PROVIDER_SITE_OTHER): Payer: Self-pay

## 2023-02-28 DIAGNOSIS — D492 Neoplasm of unspecified behavior of bone, soft tissue, and skin: Secondary | ICD-10-CM | POA: Diagnosis not present

## 2023-02-28 DIAGNOSIS — M9902 Segmental and somatic dysfunction of thoracic region: Secondary | ICD-10-CM | POA: Diagnosis not present

## 2023-02-28 DIAGNOSIS — M9904 Segmental and somatic dysfunction of sacral region: Secondary | ICD-10-CM | POA: Diagnosis not present

## 2023-02-28 DIAGNOSIS — L570 Actinic keratosis: Secondary | ICD-10-CM | POA: Diagnosis not present

## 2023-02-28 DIAGNOSIS — M9903 Segmental and somatic dysfunction of lumbar region: Secondary | ICD-10-CM | POA: Diagnosis not present

## 2023-02-28 DIAGNOSIS — M5386 Other specified dorsopathies, lumbar region: Secondary | ICD-10-CM | POA: Diagnosis not present

## 2023-03-02 ENCOUNTER — Other Ambulatory Visit: Payer: Self-pay

## 2023-03-02 DIAGNOSIS — M5386 Other specified dorsopathies, lumbar region: Secondary | ICD-10-CM | POA: Diagnosis not present

## 2023-03-02 DIAGNOSIS — E0500 Thyrotoxicosis with diffuse goiter without thyrotoxic crisis or storm: Secondary | ICD-10-CM

## 2023-03-02 DIAGNOSIS — M9904 Segmental and somatic dysfunction of sacral region: Secondary | ICD-10-CM | POA: Diagnosis not present

## 2023-03-02 DIAGNOSIS — M9902 Segmental and somatic dysfunction of thoracic region: Secondary | ICD-10-CM | POA: Diagnosis not present

## 2023-03-02 DIAGNOSIS — M9903 Segmental and somatic dysfunction of lumbar region: Secondary | ICD-10-CM | POA: Diagnosis not present

## 2023-03-02 MED ORDER — METHIMAZOLE 5 MG PO TABS: 5.0000 mg | ORAL_TABLET | Freq: Every day | ORAL | 3 refills | Status: DC

## 2023-03-09 DIAGNOSIS — M9903 Segmental and somatic dysfunction of lumbar region: Secondary | ICD-10-CM | POA: Diagnosis not present

## 2023-03-09 DIAGNOSIS — M9904 Segmental and somatic dysfunction of sacral region: Secondary | ICD-10-CM | POA: Diagnosis not present

## 2023-03-09 DIAGNOSIS — M9902 Segmental and somatic dysfunction of thoracic region: Secondary | ICD-10-CM | POA: Diagnosis not present

## 2023-03-09 DIAGNOSIS — M5386 Other specified dorsopathies, lumbar region: Secondary | ICD-10-CM | POA: Diagnosis not present

## 2023-03-14 ENCOUNTER — Ambulatory Visit (INDEPENDENT_AMBULATORY_CARE_PROVIDER_SITE_OTHER): Payer: BC Managed Care – PPO

## 2023-03-14 DIAGNOSIS — M9903 Segmental and somatic dysfunction of lumbar region: Secondary | ICD-10-CM | POA: Diagnosis not present

## 2023-03-14 DIAGNOSIS — M9902 Segmental and somatic dysfunction of thoracic region: Secondary | ICD-10-CM | POA: Diagnosis not present

## 2023-03-14 DIAGNOSIS — M9904 Segmental and somatic dysfunction of sacral region: Secondary | ICD-10-CM | POA: Diagnosis not present

## 2023-03-14 DIAGNOSIS — M5386 Other specified dorsopathies, lumbar region: Secondary | ICD-10-CM | POA: Diagnosis not present

## 2023-03-14 DIAGNOSIS — Z23 Encounter for immunization: Secondary | ICD-10-CM

## 2023-03-14 NOTE — Progress Notes (Signed)
Pt came in for his second shingrix vaccine . Gave injection in left deltoid and pt tolerated well . Pt has no concerns at this time

## 2023-03-20 DIAGNOSIS — M545 Low back pain, unspecified: Secondary | ICD-10-CM | POA: Diagnosis not present

## 2023-03-20 DIAGNOSIS — Z042 Encounter for examination and observation following work accident: Secondary | ICD-10-CM | POA: Diagnosis not present

## 2023-03-29 ENCOUNTER — Ambulatory Visit (INDEPENDENT_AMBULATORY_CARE_PROVIDER_SITE_OTHER): Payer: BC Managed Care – PPO | Admitting: Internal Medicine

## 2023-03-29 ENCOUNTER — Encounter: Payer: Self-pay | Admitting: Internal Medicine

## 2023-03-29 VITALS — BP 108/60 | HR 65 | Resp 18 | Ht 66.0 in | Wt 152.6 lb

## 2023-03-29 DIAGNOSIS — Z8639 Personal history of other endocrine, nutritional and metabolic disease: Secondary | ICD-10-CM

## 2023-03-29 DIAGNOSIS — E0500 Thyrotoxicosis with diffuse goiter without thyrotoxic crisis or storm: Secondary | ICD-10-CM

## 2023-03-29 NOTE — Patient Instructions (Signed)
Please continue Methimazole 5 mg daily.  Please stop at the lab.  Please return in 1 year.

## 2023-03-30 LAB — HEMOGLOBIN A1C: Hgb A1c MFr Bld: 5.7 % (ref 4.6–6.5)

## 2023-03-30 LAB — TSH: TSH: 1.98 u[IU]/mL (ref 0.35–5.50)

## 2023-03-30 LAB — T4, FREE: Free T4: 0.97 ng/dL (ref 0.60–1.60)

## 2023-03-30 LAB — T3, FREE: T3, Free: 3.2 pg/mL (ref 2.3–4.2)

## 2023-04-04 DIAGNOSIS — G8929 Other chronic pain: Secondary | ICD-10-CM | POA: Diagnosis not present

## 2023-04-04 DIAGNOSIS — M545 Low back pain, unspecified: Secondary | ICD-10-CM | POA: Diagnosis not present

## 2023-04-04 DIAGNOSIS — Z042 Encounter for examination and observation following work accident: Secondary | ICD-10-CM | POA: Diagnosis not present

## 2023-05-30 DIAGNOSIS — M545 Low back pain, unspecified: Secondary | ICD-10-CM | POA: Insufficient documentation

## 2023-06-04 ENCOUNTER — Encounter: Payer: BC Managed Care – PPO | Admitting: Family Medicine

## 2023-09-20 ENCOUNTER — Encounter: Payer: Self-pay | Admitting: Family Medicine

## 2023-09-20 ENCOUNTER — Ambulatory Visit: Payer: BC Managed Care – PPO | Admitting: Family Medicine

## 2023-09-20 VITALS — BP 104/70 | HR 70 | Temp 98.0°F | Ht 64.5 in | Wt 144.4 lb

## 2023-09-20 DIAGNOSIS — Z0000 Encounter for general adult medical examination without abnormal findings: Secondary | ICD-10-CM | POA: Diagnosis not present

## 2023-09-20 DIAGNOSIS — Z125 Encounter for screening for malignant neoplasm of prostate: Secondary | ICD-10-CM | POA: Diagnosis not present

## 2023-09-20 DIAGNOSIS — Z8639 Personal history of other endocrine, nutritional and metabolic disease: Secondary | ICD-10-CM

## 2023-09-20 LAB — BASIC METABOLIC PANEL
BUN: 17 mg/dL (ref 6–23)
CO2: 27 meq/L (ref 19–32)
Calcium: 9.6 mg/dL (ref 8.4–10.5)
Chloride: 102 meq/L (ref 96–112)
Creatinine, Ser: 1.03 mg/dL (ref 0.40–1.50)
GFR: 83.73 mL/min (ref >60.00)
Glucose, Bld: 87 mg/dL (ref 70–99)
Potassium: 4.5 meq/L (ref 3.5–5.1)
Sodium: 137 meq/L (ref 135–145)

## 2023-09-20 LAB — CBC WITH DIFFERENTIAL/PLATELET
Basophils Absolute: 0 10*3/uL (ref 0.0–0.1)
Basophils Relative: 0.9 % (ref 0.0–3.0)
Eosinophils Absolute: 0.3 10*3/uL (ref 0.0–0.7)
Eosinophils Relative: 4.9 % (ref 0.0–5.0)
HCT: 42.5 % (ref 39.0–52.0)
Hemoglobin: 14.1 g/dL (ref 13.0–17.0)
Lymphocytes Relative: 37.4 % (ref 12.0–46.0)
Lymphs Abs: 2 10*3/uL (ref 0.7–4.0)
MCHC: 33.2 g/dL (ref 30.0–36.0)
MCV: 84.2 fl (ref 78.0–100.0)
Monocytes Absolute: 0.3 10*3/uL (ref 0.1–1.0)
Monocytes Relative: 6 % (ref 3.0–12.0)
Neutro Abs: 2.7 10*3/uL (ref 1.4–7.7)
Neutrophils Relative %: 50.8 % (ref 43.0–77.0)
Platelets: 233 10*3/uL (ref 150.0–400.0)
RBC: 5.04 Mil/uL (ref 4.22–5.81)
RDW: 14.2 % (ref 11.5–15.5)
WBC: 5.4 10*3/uL (ref 4.0–10.5)

## 2023-09-20 LAB — LIPID PANEL
Cholesterol: 178 mg/dL (ref 0–200)
HDL: 49.2 mg/dL (ref >39.00)
LDL Cholesterol: 107 mg/dL — ABNORMAL HIGH (ref 0–99)
NonHDL: 128.9
Total CHOL/HDL Ratio: 4
Triglycerides: 110 mg/dL (ref 0.0–149.0)
VLDL: 22 mg/dL (ref 0.0–40.0)

## 2023-09-20 LAB — HEPATIC FUNCTION PANEL
ALT: 24 U/L (ref 0–53)
AST: 27 U/L (ref 0–37)
Albumin: 4.7 g/dL (ref 3.5–5.2)
Alkaline Phosphatase: 58 U/L (ref 39–117)
Bilirubin, Direct: 0.1 mg/dL (ref 0.0–0.3)
Total Bilirubin: 0.8 mg/dL (ref 0.2–1.2)
Total Protein: 6.9 g/dL (ref 6.0–8.3)

## 2023-09-20 LAB — TSH: TSH: 1.79 u[IU]/mL (ref 0.35–5.50)

## 2023-09-20 LAB — PSA: PSA: 1.37 ng/mL (ref 0.10–4.00)

## 2023-09-20 LAB — HEMOGLOBIN A1C: Hgb A1c MFr Bld: 5.6 % (ref 4.6–6.5)

## 2023-09-20 NOTE — Patient Instructions (Signed)
Follow up in 1 year or as needed We'll notify you of your lab results and make any changes if needed Keep up the good work on healthy diet and regular exercise- you look great!!! Call with any questions or concerns Stay Safe!  Stay Healthy! Happy Spring!!! 

## 2023-09-20 NOTE — Assessment & Plan Note (Signed)
Pt's PE WNL.  UTD on Tdap, colonoscopy.  Check labs.  Anticipatory guidance provided.  

## 2023-09-20 NOTE — Progress Notes (Signed)
   Subjective:    Patient ID: Christopher Mckenzie, male    DOB: [birth year], 53 y.o.   MRN: [id number]  HPI CPE-  UTD on Tdap, colonoscopy, flu.  Pt reports feeling well.  Patient Care Team    Relationship Specialty Notifications Start End  Sheliah Hatch, MD PCP - General Family Medicine  06/13/17   Sherrilyn Rist, MD Consulting Physician Gastroenterology  06/13/21   Romero Belling, MD (Inactive) Consulting Physician Endocrinology  06/13/21     Health Maintenance  Topic Date Due   Pneumococcal Vaccine 25-31 Years old (1 of 2 - PCV) Never done   DTaP/Tdap/Td (5 - Td or Tdap) 12/04/2029   Colonoscopy  01/29/2030   INFLUENZA VACCINE  Completed   Hepatitis C Screening  Completed   HIV Screening  Completed   Zoster Vaccines- Shingrix  Completed   HPV VACCINES  Aged Out   COVID-19 Vaccine  Discontinued      Review of Systems Patient reports no vision/hearing changes, anorexia, fever ,adenopathy, persistant/recurrent hoarseness, swallowing issues, chest pain, palpitations, edema, persistant/recurrent cough, hemoptysis, dyspnea (rest,exertional, paroxysmal nocturnal), gastrointestinal  bleeding (melena, rectal bleeding), abdominal pain, excessive heart burn, GU symptoms (dysuria, hematuria, voiding/incontinence issues) syncope, focal weakness, memory loss, numbness & tingling, skin/hair/nail changes, depression, anxiety, abnormal bruising/bleeding, musculoskeletal symptoms/signs.     Objective:   Physical Exam General Appearance:    Alert, cooperative, no distress, appears stated age  Head:    Normocephalic, without obvious abnormality, atraumatic  Eyes:    PERRL, conjunctiva/corneas clear, EOM's intact both eyes       Ears:    Normal TM's and external ear canals, both ears  Nose:   Nares normal, septum midline, mucosa normal, no drainage   or sinus tenderness  Throat:   Lips, mucosa, and tongue normal; teeth and gums normal  Neck:   Supple, symmetrical, trachea midline, no adenopathy;        thyroid:  No enlargement/tenderness/nodules  Back:     Symmetric, no curvature, ROM normal, no CVA tenderness  Lungs:     Clear to auscultation bilaterally, respirations unlabored  Chest wall:    No tenderness or deformity  Heart:    Regular rate and rhythm, S1 and S2 normal, no murmur, rub   or gallop  Abdomen:     Soft, non-tender, bowel sounds active all four quadrants,    no masses, no organomegaly  Genitalia:     Rectal:    Extremities:   Extremities normal, atraumatic, no cyanosis or edema  Pulses:   2+ and symmetric all extremities  Skin:   Skin color, texture, turgor normal, no rashes or lesions  Lymph nodes:   Cervical, supraclavicular, and axillary nodes normal  Neurologic:   CNII-XII intact. Normal strength, sensation and reflexes      throughout          Assessment & Plan:

## 2023-09-21 ENCOUNTER — Encounter: Payer: Self-pay | Admitting: Family Medicine

## 2023-10-10 DIAGNOSIS — D2361 Other benign neoplasm of skin of right upper limb, including shoulder: Secondary | ICD-10-CM | POA: Diagnosis not present

## 2023-10-10 DIAGNOSIS — L821 Other seborrheic keratosis: Secondary | ICD-10-CM | POA: Diagnosis not present

## 2023-10-10 DIAGNOSIS — L814 Other melanin hyperpigmentation: Secondary | ICD-10-CM | POA: Diagnosis not present

## 2024-03-26 ENCOUNTER — Other Ambulatory Visit: Payer: Self-pay | Admitting: Internal Medicine

## 2024-03-26 DIAGNOSIS — E0500 Thyrotoxicosis with diffuse goiter without thyrotoxic crisis or storm: Secondary | ICD-10-CM

## 2024-03-27 ENCOUNTER — Encounter: Payer: Self-pay | Admitting: Internal Medicine

## 2024-03-28 ENCOUNTER — Ambulatory Visit (INDEPENDENT_AMBULATORY_CARE_PROVIDER_SITE_OTHER): Payer: BC Managed Care – PPO | Admitting: Internal Medicine

## 2024-03-28 ENCOUNTER — Encounter: Payer: Self-pay | Admitting: Internal Medicine

## 2024-03-28 VITALS — BP 98/60 | HR 65 | Resp 16 | Ht 64.5 in | Wt 148.0 lb

## 2024-03-28 DIAGNOSIS — E0500 Thyrotoxicosis with diffuse goiter without thyrotoxic crisis or storm: Secondary | ICD-10-CM | POA: Diagnosis not present

## 2024-03-28 LAB — T4, FREE: Free T4: 1.2 ng/dL (ref 0.8–1.8)

## 2024-03-28 LAB — T3, FREE: T3, Free: 3.4 pg/mL (ref 2.3–4.2)

## 2024-03-28 LAB — TSH: TSH: 1.43 m[IU]/L (ref 0.40–4.50)

## 2024-03-28 NOTE — Progress Notes (Addendum)
 Patient ID: Christopher Mckenzie, male   DOB: [birth year], 53 y.o.   MRN: [id number]  HPI  Christopher Mckenzie is a 54 y.o.-year-old male, returning for follow-up for Graves' disease.  He previously saw Dr. Kassie, but last visit with me 1 year ago.  Interim history: He feels well, without complaints today.  He denies tremors, palpitations, increased fatigue, anxiety, insomnia, significant weight loss, heat intolerance.  Reviewed history: Patient was found to have a suppressed TSH  in 2011 after he presented with significant weight loss.  He was referred to Dr. Kassie at that time.  Graves' disease was presumed.  He was started on methimazole , and had several dose changes over the years, currently on 5 mg daily.    I reviewed pt's thyroid  tests: Lab Results  Component Value Date   TSH 1.79 09/20/2023   TSH 1.98 03/29/2023   TSH 1.68 09/14/2022   TSH 0.75 03/14/2022   TSH 0.86 09/07/2021   TSH 0.92 06/13/2021   TSH 0.32 (L) 03/25/2021   TSH 7.32 (H) 12/05/2019   TSH 0.99 12/23/2018   TSH 1.49 08/06/2018   FREET4 0.97 03/29/2023   FREET4 0.94 03/14/2022   FREET4 0.97 09/07/2021   FREET4 0.89 03/25/2021   FREET4 1.02 08/06/2018   FREET4 0.79 04/29/2018   FREET4 0.75 11/13/2016   FREET4 0.93 08/06/2015   FREET4 1.12 07/29/2014   FREET4 0.87 04/03/2014   T3FREE 3.2 03/29/2023   T3FREE 3.2 03/14/2022   T3FREE 14.6 (H) 08/26/2010   Antithyroid antibodies: Lab Results  Component Value Date   TSI 124 03/14/2022   Pt denies: - feeling nodules in neck - hoarseness - dysphagia - choking  + FH of thyroid  cancer - s/p surgery and RAI Tx (dx'ed at 53 y/o). No h/o radiation tx to head or neck. No steroid use. No herbal supplements. No Biotin use.  He was also dx'ed with DM at the same time when he was diagnosed with Graves' disease, as, at that time, HbA1c was high, at 7.5%.  Afterwards, HbA1c level normalized: Lab Results  Component Value Date   HGBA1C 5.6 09/20/2023   HGBA1C 5.7 03/29/2023    HGBA1C 5.2 03/14/2022   HGBA1C 5.6 09/07/2021   HGBA1C 5.6 03/25/2021   HGBA1C 5.1 12/05/2019   HGBA1C 5.5 12/23/2018   HGBA1C 5.2 04/29/2018   HGBA1C 5.4 11/13/2016   HGBA1C 5.5 08/06/2015   HGBA1C 5.6 07/29/2014   HGBA1C 5.8 05/12/2013   HGBA1C 5.5 11/27/2012   HGBA1C 5.4 12/01/2011   HGBA1C 5.3 11/17/2010   HGBA1C 7.5 (H) 08/26/2010   No increased urination, blurry vision, nausea, chest pain.  ROS: + see HPI  Past Medical History:  Diagnosis Date   ANOSMIA 05/13/2007   Diabetes (HCC) 09/07/2021   HYPERTHYROIDISM 08/29/2010   Skin cancer of face    WEIGHT LOSS 08/26/2010   Past Surgical History:  Procedure Laterality Date   VASECTOMY  07/2007   Social History   Socioeconomic History   Marital status: Married    Spouse name: Not on file   Number of children: 1   Years of education: Not on file   Highest education level: Not on file  Occupational History    Employer: [employer]  Tobacco Use   Smoking status: Never   Smokeless tobacco: Never  Vaping Use   Vaping status: Never Used  Substance and Sexual Activity   Alcohol use: Yes    Alcohol/week: 1.0 standard drink of alcohol    Types: 1 Cans of beer per  week   Drug use: No   Sexual activity: Yes  Other Topics Concern   Not on file  Social History Narrative   Originally from [place]  x many years   Regular Exercise-Yes   Married 1 child-2007   Social Drivers of Health   Financial Resource Strain: Not on file  Food Insecurity: Low Risk  (04/04/2023)   Received from [facility]   Hunger Vital Sign    Within the past 12 months, you worried that your food would run out before you got money to buy more: Never true    Within the past 12 months, the food you bought just didn't last and you didn't have money to get more. : Never true  Transportation Needs: No Transportation Needs (04/04/2023)   Received from [facility]    In the past 12 months, has lack of reliable transportation  kept you from medical appointments, meetings, work or from getting things needed for daily living? : No  Physical Activity: Not on file  Stress: Not on file  Social Connections: Not on file  Intimate Partner Violence: Not on file   Current Outpatient Medications on File Prior to Visit  Medication Sig Dispense Refill   methimazole  (TAPAZOLE ) 5 MG tablet TAKE 1 TABLET (5 MG TOTAL) BY MOUTH DAILY. 90 tablet 0   No current facility-administered medications on file prior to visit.   No Known Allergies Family History  Problem Relation Age of Onset   Kidney disease Mother        Kidney Cancer   Cancer Mother        thyroid  and kidney cancer   Diabetes Father    Heart disease Neg Hx    Colon cancer Neg Hx    Colon polyps Neg Hx    Stomach cancer Neg Hx    Esophageal cancer Neg Hx    PE: BP 98/60   Pulse 65   Resp 16   Ht 5' 4.5 (1.638 m)   Wt 148 lb (67.1 kg)   SpO2 99%   BMI 25.01 kg/m  Wt Readings from Last 3 Encounters:  03/28/24 148 lb (67.1 kg)  09/20/23 144 lb 6.4 oz (65.5 kg)  03/29/23 152 lb 9.6 oz (69.2 kg)   Constitutional: normal weight, in NAD Eyes:  EOMI, no exophthalmos ENT: no neck masses, no cervical lymphadenopathy Cardiovascular: RRR, No MRG Respiratory: CTA B Musculoskeletal: no deformities Skin:no rashes Neurological: no tremor with outstretched hands  ASSESSMENT: 1.  Graves disease  2. H/o hyperglycemia   PLAN:  1. Patient with history of low TSH, initially with significant weight loss, presumed to have Graves' disease.  Recheck TSI antibodies but these were not elevated.  Graves' disease is still the most likely etiology due to the protracted course and response to methimazole . - We previously discussed about etiology of Graves' disease, possible complications, including Graves' ophthalmopathy and, if TFTs were not controlled, cardiac and pulmonary complications.  We also discussed about possible treatments for this condition including low-dose  methimazole , which she is now accepted as a long term method of treatment for patients with well-controlled TFTs, but also RAI treatment and thyroidectomy.  We decided to continue low-dose methimazole , which she is tolerating well.  Most recent LFTs were normal in 09/2023. - In 03/2023, he, his TFTs were normal.  These were repeated in 09/2023 and the TSH was again normal - At today's visit, he denies tremors, palpitations, unintentional weight loss (however, he did lose  a net 4 pounds since last visit but he mentions that his weight usually fluctuates around 150 pounds and today he weighs 148 pounds), heat intolerance, hyperdefecation, anxiety, insomnia. - His pulse is normal at today's visit so no beta-blockers are needed - He has no signs of active Graves' ophthalmopathy including double vision, blurry vision, eye pain, chemosis - At today's visit, we will check his TFTs and change the methimazole  dose accordingly - Otherwise, we will see him back in a year, but possibly sooner for labs  2. H/o hyperglycemia - Patient had an elevated HbA1c, at 7.5% at the time of his diagnosis of Graves' disease.  He was losing weight at that time but we discussed that this could be related to Graves' disease - Afterwards, HbA1c decreased to 5.2%, but at last visit, this was borderline between normal and prediabetes, at 5.7% - Since last visit, he had another HbA1c obtained 09/20/2023 and this was also normal, at 5.6% - Will continue to keep an eye on this but no intervention is needed for now  Component     Latest Ref Rng 03/28/2024  TSH     0.40 - 4.50 mIU/L 1.43   T4,Free(Direct)     0.8 - 1.8 ng/dL 1.2   Triiodothyronine,Free,Serum     2.3 - 4.2 pg/mL 3.4   Normal TFTs.  Lela Fendt, MD PhD [facility]

## 2024-03-28 NOTE — Patient Instructions (Signed)
Please continue Methimazole 5 mg daily.  Please stop at the lab.  Please return in 1 year.

## 2024-04-01 ENCOUNTER — Ambulatory Visit: Payer: Self-pay | Admitting: Internal Medicine

## 2024-04-01 MED ORDER — METHIMAZOLE 5 MG PO TABS
5.0000 mg | ORAL_TABLET | Freq: Every day | ORAL | 3 refills | Status: AC
Start: 2024-04-01 — End: ?

## 2024-04-01 NOTE — Addendum Note (Signed)
 Addended by: TRIXIE FILE on: 04/01/2024 10:04 AM   Modules accepted: Orders

## 2024-09-22 ENCOUNTER — Encounter: Admitting: Family Medicine

## 2025-04-03 ENCOUNTER — Ambulatory Visit: Admitting: Internal Medicine
